# Patient Record
Sex: Male | Born: 1965
Health system: Southern US, Community
[De-identification: ages and names within clinical notes are randomized; demographics above are authoritative.]

## PROBLEM LIST (undated history)

## (undated) DIAGNOSIS — F329 Major depressive disorder, single episode, unspecified: Secondary | ICD-10-CM

## (undated) DIAGNOSIS — B20 Human immunodeficiency virus [HIV] disease: Secondary | ICD-10-CM

## (undated) DIAGNOSIS — A539 Syphilis, unspecified: Secondary | ICD-10-CM

## (undated) DIAGNOSIS — Z21 Asymptomatic human immunodeficiency virus [HIV] infection status: Secondary | ICD-10-CM

## (undated) DIAGNOSIS — R739 Hyperglycemia, unspecified: Secondary | ICD-10-CM

## (undated) DIAGNOSIS — A159 Respiratory tuberculosis unspecified: Secondary | ICD-10-CM

## (undated) DIAGNOSIS — F32A Depression, unspecified: Secondary | ICD-10-CM

## (undated) DIAGNOSIS — F419 Anxiety disorder, unspecified: Secondary | ICD-10-CM

## (undated) DIAGNOSIS — N200 Calculus of kidney: Secondary | ICD-10-CM

## (undated) DIAGNOSIS — I10 Essential (primary) hypertension: Secondary | ICD-10-CM

## (undated) HISTORY — DX: Calculus of kidney: N20.0

## (undated) HISTORY — DX: Hyperglycemia, unspecified: R73.9

## (undated) HISTORY — DX: Anxiety disorder, unspecified: F41.9

## (undated) HISTORY — DX: Essential (primary) hypertension: I10

## (undated) HISTORY — DX: Syphilis, unspecified: A53.9

## (undated) HISTORY — DX: Respiratory tuberculosis unspecified: A15.9

## (undated) HISTORY — DX: Asymptomatic human immunodeficiency virus (hiv) infection status: Z21

## (undated) HISTORY — DX: Depression, unspecified: F32.A

## (undated) HISTORY — DX: Major depressive disorder, single episode, unspecified: F32.9

## (undated) HISTORY — DX: Human immunodeficiency virus (HIV) disease: B20

---

## 1998-02-26 DIAGNOSIS — B20 Human immunodeficiency virus [HIV] disease: Secondary | ICD-10-CM | POA: Insufficient documentation

## 1998-03-16 ENCOUNTER — Encounter (INDEPENDENT_AMBULATORY_CARE_PROVIDER_SITE_OTHER): Payer: Self-pay | Admitting: *Deleted

## 1998-03-16 LAB — CONVERTED CEMR LAB
CD4 Count: 50 microliters
CD4 T Cell Abs: 50

## 1998-03-18 ENCOUNTER — Encounter: Admission: RE | Admit: 1998-03-18 | Discharge: 1998-03-18 | Payer: Self-pay | Admitting: Infectious Diseases

## 1998-03-25 ENCOUNTER — Encounter: Admission: RE | Admit: 1998-03-25 | Discharge: 1998-03-25 | Payer: Self-pay | Admitting: Infectious Diseases

## 1998-04-01 ENCOUNTER — Encounter: Admission: RE | Admit: 1998-04-01 | Discharge: 1998-04-01 | Payer: Self-pay | Admitting: Infectious Diseases

## 1998-04-07 ENCOUNTER — Encounter: Admission: RE | Admit: 1998-04-07 | Discharge: 1998-04-07 | Payer: Self-pay | Admitting: Infectious Diseases

## 1998-06-02 ENCOUNTER — Encounter: Admission: RE | Admit: 1998-06-02 | Discharge: 1998-06-02 | Payer: Self-pay | Admitting: Infectious Diseases

## 1998-06-02 ENCOUNTER — Ambulatory Visit (HOSPITAL_COMMUNITY): Admission: RE | Admit: 1998-06-02 | Discharge: 1998-06-02 | Payer: Self-pay | Admitting: Infectious Diseases

## 1998-07-28 ENCOUNTER — Encounter: Admission: RE | Admit: 1998-07-28 | Discharge: 1998-07-28 | Payer: Self-pay | Admitting: Internal Medicine

## 1998-08-27 ENCOUNTER — Encounter: Admission: RE | Admit: 1998-08-27 | Discharge: 1998-08-27 | Payer: Self-pay | Admitting: Infectious Diseases

## 1998-09-22 ENCOUNTER — Encounter: Admission: RE | Admit: 1998-09-22 | Discharge: 1998-09-22 | Payer: Self-pay | Admitting: Infectious Diseases

## 1998-11-17 ENCOUNTER — Encounter: Admission: RE | Admit: 1998-11-17 | Discharge: 1998-11-17 | Payer: Self-pay | Admitting: Infectious Diseases

## 1999-01-13 ENCOUNTER — Encounter: Admission: RE | Admit: 1999-01-13 | Discharge: 1999-01-13 | Payer: Self-pay | Admitting: Infectious Diseases

## 1999-02-09 ENCOUNTER — Encounter: Admission: RE | Admit: 1999-02-09 | Discharge: 1999-02-09 | Payer: Self-pay | Admitting: Infectious Diseases

## 1999-03-09 ENCOUNTER — Encounter: Admission: RE | Admit: 1999-03-09 | Discharge: 1999-03-09 | Payer: Self-pay | Admitting: Infectious Diseases

## 1999-05-06 ENCOUNTER — Encounter: Admission: RE | Admit: 1999-05-06 | Discharge: 1999-05-06 | Payer: Self-pay | Admitting: Infectious Diseases

## 1999-06-30 ENCOUNTER — Encounter: Admission: RE | Admit: 1999-06-30 | Discharge: 1999-06-30 | Payer: Self-pay | Admitting: Infectious Diseases

## 1999-08-25 ENCOUNTER — Encounter: Admission: RE | Admit: 1999-08-25 | Discharge: 1999-08-25 | Payer: Self-pay | Admitting: Infectious Diseases

## 1999-10-19 ENCOUNTER — Encounter: Admission: RE | Admit: 1999-10-19 | Discharge: 1999-10-19 | Payer: Self-pay | Admitting: Infectious Diseases

## 1999-12-16 ENCOUNTER — Encounter: Admission: RE | Admit: 1999-12-16 | Discharge: 1999-12-16 | Payer: Self-pay | Admitting: Infectious Diseases

## 2000-01-20 ENCOUNTER — Encounter: Admission: RE | Admit: 2000-01-20 | Discharge: 2000-01-20 | Payer: Self-pay | Admitting: Infectious Diseases

## 2000-02-08 ENCOUNTER — Encounter: Admission: RE | Admit: 2000-02-08 | Discharge: 2000-02-08 | Payer: Self-pay | Admitting: Infectious Diseases

## 2000-02-08 ENCOUNTER — Ambulatory Visit (HOSPITAL_COMMUNITY): Admission: RE | Admit: 2000-02-08 | Discharge: 2000-02-08 | Payer: Self-pay | Admitting: Infectious Diseases

## 2000-03-30 ENCOUNTER — Encounter: Admission: RE | Admit: 2000-03-30 | Discharge: 2000-03-30 | Payer: Self-pay | Admitting: Infectious Diseases

## 2000-06-01 ENCOUNTER — Encounter: Admission: RE | Admit: 2000-06-01 | Discharge: 2000-06-01 | Payer: Self-pay | Admitting: Infectious Diseases

## 2000-07-26 ENCOUNTER — Encounter: Admission: RE | Admit: 2000-07-26 | Discharge: 2000-07-26 | Payer: Self-pay | Admitting: Infectious Diseases

## 2000-08-31 ENCOUNTER — Encounter: Admission: RE | Admit: 2000-08-31 | Discharge: 2000-08-31 | Payer: Self-pay | Admitting: Infectious Diseases

## 2000-09-01 ENCOUNTER — Ambulatory Visit (HOSPITAL_COMMUNITY): Admission: RE | Admit: 2000-09-01 | Discharge: 2000-09-01 | Payer: Self-pay | Admitting: Infectious Diseases

## 2000-09-01 ENCOUNTER — Encounter: Payer: Self-pay | Admitting: Infectious Diseases

## 2000-09-05 ENCOUNTER — Encounter: Admission: RE | Admit: 2000-09-05 | Discharge: 2000-09-05 | Payer: Self-pay | Admitting: Infectious Diseases

## 2000-09-19 ENCOUNTER — Encounter: Admission: RE | Admit: 2000-09-19 | Discharge: 2000-09-19 | Payer: Self-pay | Admitting: Infectious Diseases

## 2000-10-03 ENCOUNTER — Encounter: Admission: RE | Admit: 2000-10-03 | Discharge: 2000-10-03 | Payer: Self-pay | Admitting: Infectious Diseases

## 2000-10-03 ENCOUNTER — Ambulatory Visit (HOSPITAL_COMMUNITY): Admission: RE | Admit: 2000-10-03 | Discharge: 2000-10-03 | Payer: Self-pay | Admitting: Infectious Diseases

## 2000-10-03 ENCOUNTER — Encounter: Payer: Self-pay | Admitting: Infectious Diseases

## 2000-10-31 ENCOUNTER — Encounter: Admission: RE | Admit: 2000-10-31 | Discharge: 2000-10-31 | Payer: Self-pay | Admitting: Infectious Diseases

## 2000-11-15 ENCOUNTER — Encounter: Admission: RE | Admit: 2000-11-15 | Discharge: 2000-11-15 | Payer: Self-pay | Admitting: Infectious Diseases

## 2001-01-10 ENCOUNTER — Encounter: Admission: RE | Admit: 2001-01-10 | Discharge: 2001-01-10 | Payer: Self-pay | Admitting: Infectious Diseases

## 2001-01-31 ENCOUNTER — Ambulatory Visit (HOSPITAL_COMMUNITY): Admission: RE | Admit: 2001-01-31 | Discharge: 2001-01-31 | Payer: Self-pay | Admitting: Infectious Diseases

## 2001-01-31 ENCOUNTER — Encounter: Payer: Self-pay | Admitting: Infectious Diseases

## 2001-03-06 ENCOUNTER — Encounter: Admission: RE | Admit: 2001-03-06 | Discharge: 2001-03-06 | Payer: Self-pay | Admitting: Infectious Diseases

## 2001-03-07 ENCOUNTER — Encounter: Admission: RE | Admit: 2001-03-07 | Discharge: 2001-03-07 | Payer: Self-pay | Admitting: Infectious Diseases

## 2001-05-01 ENCOUNTER — Encounter: Admission: RE | Admit: 2001-05-01 | Discharge: 2001-05-01 | Payer: Self-pay | Admitting: Infectious Diseases

## 2001-05-29 ENCOUNTER — Encounter: Admission: RE | Admit: 2001-05-29 | Discharge: 2001-05-29 | Payer: Self-pay | Admitting: Infectious Diseases

## 2001-06-27 ENCOUNTER — Encounter: Admission: RE | Admit: 2001-06-27 | Discharge: 2001-06-27 | Payer: Self-pay | Admitting: Infectious Diseases

## 2001-08-29 ENCOUNTER — Encounter: Admission: RE | Admit: 2001-08-29 | Discharge: 2001-08-29 | Payer: Self-pay | Admitting: Infectious Diseases

## 2001-12-12 ENCOUNTER — Encounter: Admission: RE | Admit: 2001-12-12 | Discharge: 2001-12-12 | Payer: Self-pay | Admitting: Infectious Diseases

## 2002-01-15 ENCOUNTER — Encounter: Admission: RE | Admit: 2002-01-15 | Discharge: 2002-01-15 | Payer: Self-pay | Admitting: Infectious Diseases

## 2002-04-02 ENCOUNTER — Encounter: Admission: RE | Admit: 2002-04-02 | Discharge: 2002-04-02 | Payer: Self-pay | Admitting: Infectious Diseases

## 2002-07-23 ENCOUNTER — Encounter: Admission: RE | Admit: 2002-07-23 | Discharge: 2002-07-23 | Payer: Self-pay | Admitting: Infectious Diseases

## 2002-10-24 ENCOUNTER — Encounter: Admission: RE | Admit: 2002-10-24 | Discharge: 2002-10-24 | Payer: Self-pay | Admitting: Infectious Diseases

## 2002-11-12 ENCOUNTER — Encounter: Admission: RE | Admit: 2002-11-12 | Discharge: 2002-11-12 | Payer: Self-pay | Admitting: Infectious Diseases

## 2003-03-04 ENCOUNTER — Encounter: Admission: RE | Admit: 2003-03-04 | Discharge: 2003-03-04 | Payer: Self-pay | Admitting: Infectious Diseases

## 2003-10-21 ENCOUNTER — Encounter: Admission: RE | Admit: 2003-10-21 | Discharge: 2003-10-21 | Payer: Self-pay | Admitting: Infectious Diseases

## 2003-12-09 ENCOUNTER — Encounter: Admission: RE | Admit: 2003-12-09 | Discharge: 2003-12-09 | Payer: Self-pay | Admitting: Infectious Diseases

## 2004-02-03 ENCOUNTER — Encounter: Admission: RE | Admit: 2004-02-03 | Discharge: 2004-02-03 | Payer: Self-pay | Admitting: Infectious Diseases

## 2004-05-26 ENCOUNTER — Ambulatory Visit: Payer: Self-pay | Admitting: Infectious Diseases

## 2004-09-15 ENCOUNTER — Ambulatory Visit: Payer: Self-pay | Admitting: Infectious Diseases

## 2005-01-04 ENCOUNTER — Ambulatory Visit: Payer: Self-pay | Admitting: Infectious Diseases

## 2005-01-08 ENCOUNTER — Ambulatory Visit: Payer: Self-pay | Admitting: Infectious Diseases

## 2005-01-18 ENCOUNTER — Ambulatory Visit: Payer: Self-pay | Admitting: Infectious Diseases

## 2005-02-08 ENCOUNTER — Ambulatory Visit: Payer: Self-pay | Admitting: Infectious Diseases

## 2005-03-08 ENCOUNTER — Ambulatory Visit: Payer: Self-pay | Admitting: Infectious Diseases

## 2005-04-14 ENCOUNTER — Ambulatory Visit (HOSPITAL_COMMUNITY): Admission: RE | Admit: 2005-04-14 | Discharge: 2005-04-14 | Payer: Self-pay | Admitting: Infectious Diseases

## 2005-04-14 IMAGING — CR DG CHEST 2V
2 series · 2 of 2 positions shown · non-contrast
Comparison: [DATE]

CLINICAL DATA: History of TB exposure, asymptomatic.
 CHEST ? 2 VIEWS:

[view not recorded (1 of 2)]
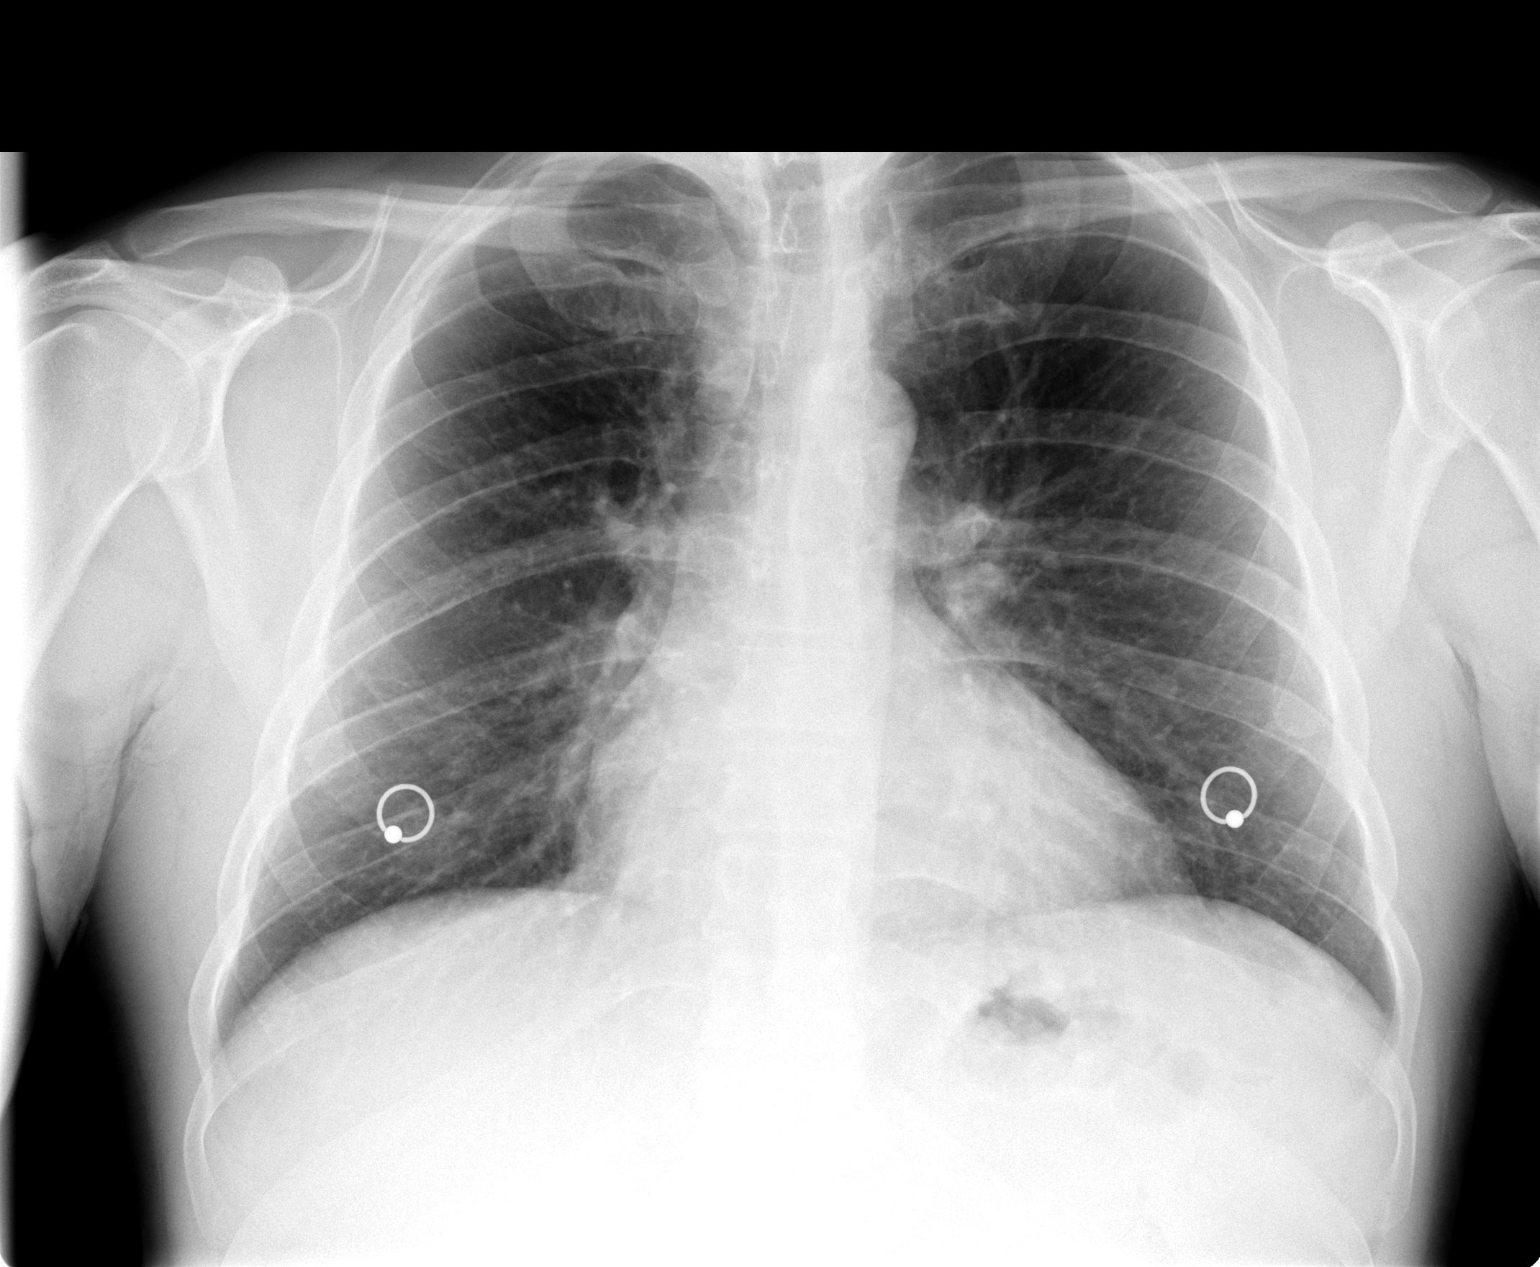

[view not recorded (2 of 2)]
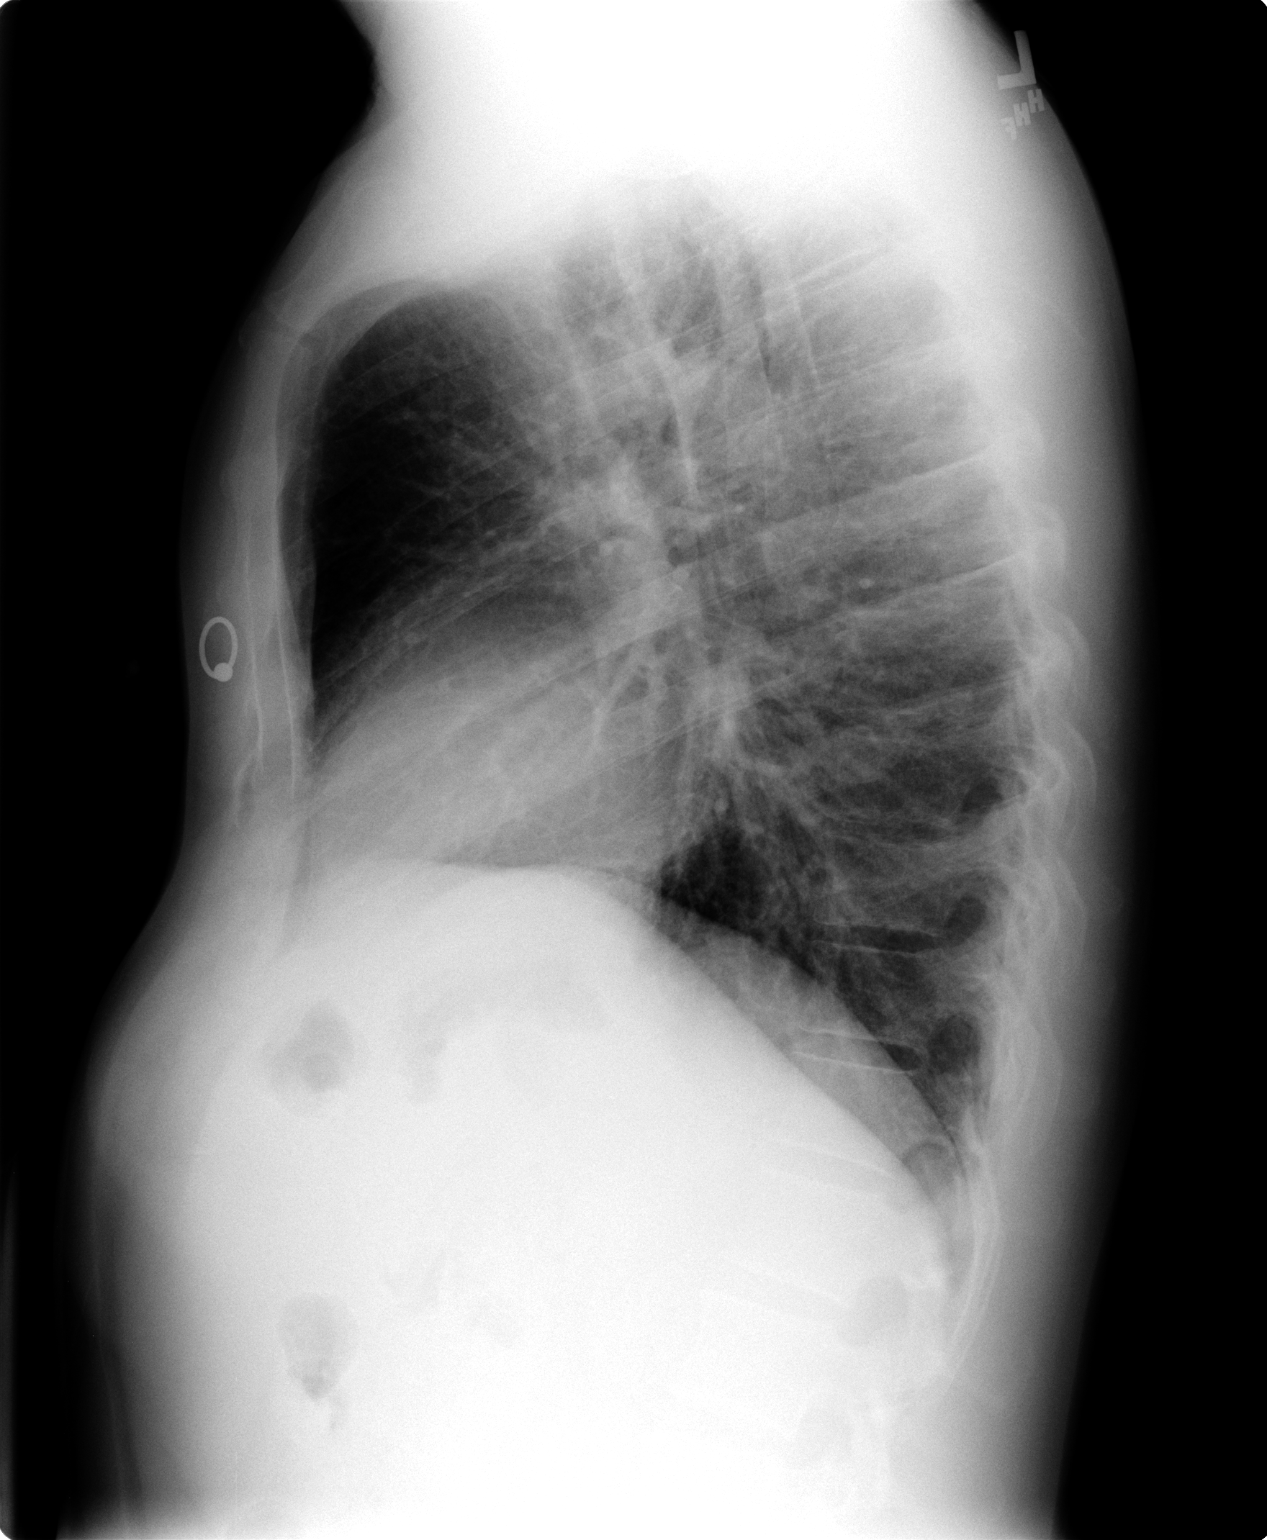

[2 of 2 positions shown; findings below may reference images not displayed]

FINDINGS: PA and lateral views of the chest are made and are compared to previous studies of [DATE] and again show diffuse mild peribronchial thickening.  No evidence of active infiltrate or consolidation is seen.  No evidence of right hilar mass or interval change has occurred.  The heart is within the normal limit.  The bony thorax is unchanged and is within the limits of normal.  The heart and aorta are normal.
IMPRESSION: Stable diffuse peribronchial thickening.  No evidence of active disease in the chest.

## 2005-04-26 ENCOUNTER — Ambulatory Visit: Payer: Self-pay | Admitting: Infectious Diseases

## 2005-06-28 ENCOUNTER — Ambulatory Visit: Payer: Self-pay | Admitting: Infectious Diseases

## 2005-08-17 ENCOUNTER — Encounter (INDEPENDENT_AMBULATORY_CARE_PROVIDER_SITE_OTHER): Payer: Self-pay | Admitting: *Deleted

## 2005-08-17 ENCOUNTER — Ambulatory Visit: Payer: Self-pay | Admitting: Infectious Diseases

## 2005-08-17 LAB — CONVERTED CEMR LAB
CD4 Count: 630 microliters
HIV 1 RNA Quant: 49 copies/mL

## 2005-11-22 ENCOUNTER — Encounter (INDEPENDENT_AMBULATORY_CARE_PROVIDER_SITE_OTHER): Payer: Self-pay | Admitting: *Deleted

## 2005-11-22 ENCOUNTER — Ambulatory Visit: Payer: Self-pay | Admitting: Infectious Diseases

## 2005-11-22 LAB — CONVERTED CEMR LAB
CD4 Count: 449 microliters
HIV 1 RNA Quant: 49 copies/mL

## 2006-03-14 ENCOUNTER — Ambulatory Visit: Payer: Self-pay | Admitting: Infectious Diseases

## 2006-03-18 ENCOUNTER — Ambulatory Visit: Payer: Self-pay | Admitting: Internal Medicine

## 2006-05-09 ENCOUNTER — Ambulatory Visit: Payer: Self-pay | Admitting: Infectious Diseases

## 2006-06-14 ENCOUNTER — Encounter (INDEPENDENT_AMBULATORY_CARE_PROVIDER_SITE_OTHER): Payer: Self-pay | Admitting: *Deleted

## 2006-06-14 ENCOUNTER — Ambulatory Visit: Payer: Self-pay | Admitting: Infectious Diseases

## 2006-06-14 LAB — CONVERTED CEMR LAB
ALT: 29 units/L (ref 0–40)
AST: 21 units/L (ref 0–37)
Albumin: 4.5 g/dL (ref 3.5–5.2)
Alkaline Phosphatase: 108 units/L (ref 39–117)
BUN: 19 mg/dL (ref 6–23)
CD4 Count: 637 microliters
CO2: 24 meq/L (ref 19–32)
Calcium: 8.8 mg/dL (ref 8.4–10.5)
Chloride: 108 meq/L (ref 96–112)
Cholesterol: 146 mg/dL (ref 0–200)
Creatinine, Ser: 0.75 mg/dL (ref 0.40–1.50)
Creatinine, Urine: 210.9 mg/dL
Glucose, Bld: 111 mg/dL — ABNORMAL HIGH (ref 70–99)
HDL: 36 mg/dL — ABNORMAL LOW (ref >39)
HIV 1 RNA Quant: 49 copies/mL
LDL Cholesterol: 86 mg/dL (ref 0–99)
Potassium: 3.8 meq/L (ref 3.5–5.3)
RPR Ser Ql: REACTIVE — AB
RPR Titer: 1:4 {titer}
Sodium: 141 meq/L (ref 135–145)
Total Bilirubin: 0.4 mg/dL (ref 0.3–1.2)
Total CHOL/HDL Ratio: 4.1
Total Protein, Urine: 19
Total Protein: 7.1 g/dL (ref 6.0–8.3)
Triglycerides: 120 mg/dL (ref <150)
VLDL: 24 mg/dL (ref 0–40)

## 2006-06-17 DIAGNOSIS — Z8619 Personal history of other infectious and parasitic diseases: Secondary | ICD-10-CM

## 2006-06-17 DIAGNOSIS — A150 Tuberculosis of lung: Secondary | ICD-10-CM | POA: Insufficient documentation

## 2006-06-17 DIAGNOSIS — F339 Major depressive disorder, recurrent, unspecified: Secondary | ICD-10-CM

## 2006-09-27 ENCOUNTER — Encounter (INDEPENDENT_AMBULATORY_CARE_PROVIDER_SITE_OTHER): Payer: Self-pay | Admitting: Infectious Diseases

## 2006-10-10 ENCOUNTER — Encounter (INDEPENDENT_AMBULATORY_CARE_PROVIDER_SITE_OTHER): Payer: Self-pay | Admitting: *Deleted

## 2006-10-10 LAB — CONVERTED CEMR LAB: RPR Titer: 1:4 {titer}

## 2006-10-23 ENCOUNTER — Encounter (INDEPENDENT_AMBULATORY_CARE_PROVIDER_SITE_OTHER): Payer: Self-pay | Admitting: *Deleted

## 2006-10-26 ENCOUNTER — Encounter (INDEPENDENT_AMBULATORY_CARE_PROVIDER_SITE_OTHER): Payer: Self-pay | Admitting: *Deleted

## 2006-12-01 ENCOUNTER — Telehealth (INDEPENDENT_AMBULATORY_CARE_PROVIDER_SITE_OTHER): Payer: Self-pay | Admitting: Infectious Diseases

## 2006-12-01 ENCOUNTER — Encounter (INDEPENDENT_AMBULATORY_CARE_PROVIDER_SITE_OTHER): Payer: Self-pay | Admitting: Infectious Diseases

## 2007-02-13 ENCOUNTER — Encounter (INDEPENDENT_AMBULATORY_CARE_PROVIDER_SITE_OTHER): Payer: Self-pay | Admitting: Infectious Diseases

## 2007-05-08 ENCOUNTER — Ambulatory Visit: Payer: Self-pay | Admitting: Infectious Diseases

## 2007-05-11 ENCOUNTER — Encounter: Payer: Self-pay | Admitting: *Deleted

## 2007-07-24 ENCOUNTER — Ambulatory Visit: Payer: Self-pay | Admitting: Infectious Disease

## 2007-07-24 DIAGNOSIS — I10 Essential (primary) hypertension: Secondary | ICD-10-CM | POA: Insufficient documentation

## 2007-07-24 DIAGNOSIS — A6000 Herpesviral infection of urogenital system, unspecified: Secondary | ICD-10-CM | POA: Insufficient documentation

## 2007-07-24 DIAGNOSIS — F17200 Nicotine dependence, unspecified, uncomplicated: Secondary | ICD-10-CM

## 2007-08-14 ENCOUNTER — Encounter (INDEPENDENT_AMBULATORY_CARE_PROVIDER_SITE_OTHER): Payer: Self-pay | Admitting: *Deleted

## 2007-08-15 ENCOUNTER — Encounter (INDEPENDENT_AMBULATORY_CARE_PROVIDER_SITE_OTHER): Payer: Self-pay | Admitting: *Deleted

## 2007-08-28 ENCOUNTER — Ambulatory Visit: Payer: Self-pay | Admitting: Infectious Disease

## 2007-12-04 ENCOUNTER — Ambulatory Visit: Payer: Self-pay | Admitting: Infectious Disease

## 2007-12-04 LAB — CONVERTED CEMR LAB
AST: 25 units/L (ref 0–37)
Albumin: 4.5 g/dL (ref 3.5–5.2)
Alkaline Phosphatase: 118 units/L — ABNORMAL HIGH (ref 39–117)
BUN: 15 mg/dL (ref 6–23)
Basophils Absolute: 0 10*3/uL (ref 0.0–0.1)
CO2: 24 meq/L (ref 19–32)
Chloride: 106 meq/L (ref 96–112)
HDL: 31 mg/dL — ABNORMAL LOW (ref >39)
Hemoglobin: 15.8 g/dL (ref 13.0–17.0)
LDL Cholesterol: 79 mg/dL (ref 0–99)
Lymphocytes Relative: 36 % (ref 12–46)
Monocytes Absolute: 0.5 10*3/uL (ref 0.1–1.0)
Monocytes Relative: 8 % (ref 3–12)
Neutro Abs: 3.2 10*3/uL (ref 1.7–7.7)
RBC: 5.11 M/uL (ref 4.22–5.81)
Total Bilirubin: 0.5 mg/dL (ref 0.3–1.2)
Total Protein: 7.5 g/dL (ref 6.0–8.3)
Triglycerides: 133 mg/dL (ref <150)
VLDL: 27 mg/dL (ref 0–40)

## 2008-02-08 ENCOUNTER — Telehealth (INDEPENDENT_AMBULATORY_CARE_PROVIDER_SITE_OTHER): Payer: Self-pay | Admitting: *Deleted

## 2008-04-01 ENCOUNTER — Ambulatory Visit: Payer: Self-pay | Admitting: Infectious Disease

## 2008-07-17 ENCOUNTER — Encounter: Payer: Self-pay | Admitting: Infectious Disease

## 2008-07-24 ENCOUNTER — Encounter: Payer: Self-pay | Admitting: Infectious Disease

## 2008-07-29 ENCOUNTER — Ambulatory Visit: Payer: Self-pay | Admitting: Infectious Disease

## 2008-07-29 ENCOUNTER — Encounter: Payer: Self-pay | Admitting: Infectious Disease

## 2008-07-29 LAB — CONVERTED CEMR LAB
Albumin: 4.6 g/dL (ref 3.5–5.2)
Alkaline Phosphatase: 111 units/L (ref 39–117)
BUN: 11 mg/dL (ref 6–23)
Creatinine, Ser: 0.74 mg/dL (ref 0.40–1.50)
Glucose, Bld: 106 mg/dL — ABNORMAL HIGH (ref 70–99)
HDL: 31 mg/dL — ABNORMAL LOW (ref >39)
LDL Cholesterol: 94 mg/dL (ref 0–99)
RPR Titer: 1:2 {titer}
Total Bilirubin: 0.5 mg/dL (ref 0.3–1.2)
Total CHOL/HDL Ratio: 5
Triglycerides: 145 mg/dL (ref <150)
VLDL: 29 mg/dL (ref 0–40)

## 2008-11-25 ENCOUNTER — Ambulatory Visit: Payer: Self-pay | Admitting: Infectious Disease

## 2008-11-25 LAB — CONVERTED CEMR LAB
Albumin: 4.5 g/dL (ref 3.5–5.2)
Alkaline Phosphatase: 101 units/L (ref 39–117)
Basophils Absolute: 0 10*3/uL (ref 0.0–0.1)
Chloride: 106 meq/L (ref 96–112)
Eosinophils Absolute: 0.3 10*3/uL (ref 0.0–0.7)
Eosinophils Relative: 4 % (ref 0–5)
GFR calc non Af Amer: >60 mL/min (ref >60)
Glucose, Bld: 97 mg/dL (ref 70–99)
HCT: 45.7 % (ref 39.0–52.0)
LDL Cholesterol: 98 mg/dL (ref 0–99)
Lymphocytes Relative: 35 % (ref 12–46)
MCV: 90 fL (ref 78.0–100.0)
Platelets: 205 10*3/uL (ref 150–400)
Potassium: 3.8 meq/L (ref 3.5–5.3)
RDW: 13.3 % (ref 11.5–15.5)
Sodium: 139 meq/L (ref 135–145)
Total Protein: 7.1 g/dL (ref 6.0–8.3)

## 2009-03-10 ENCOUNTER — Ambulatory Visit: Payer: Self-pay | Admitting: Infectious Disease

## 2009-06-05 ENCOUNTER — Encounter: Payer: Self-pay | Admitting: Infectious Disease

## 2009-06-23 ENCOUNTER — Ambulatory Visit: Payer: Self-pay | Admitting: Infectious Disease

## 2009-06-23 ENCOUNTER — Encounter: Payer: Self-pay | Admitting: Infectious Disease

## 2009-06-23 LAB — CONVERTED CEMR LAB
Albumin: 4.5 g/dL (ref 3.5–5.2)
Alkaline Phosphatase: 107 units/L (ref 39–117)
BUN: 13 mg/dL (ref 6–23)
CD4 Count: 929 microliters
CO2: 22 meq/L (ref 19–32)
Cholesterol: 159 mg/dL (ref 0–200)
Glucose, Bld: 101 mg/dL — ABNORMAL HIGH (ref 70–99)
HDL: 31 mg/dL — ABNORMAL LOW (ref >39)
HIV 1 RNA Quant: 39 copies/mL
LDL Cholesterol: 97 mg/dL (ref 0–99)
Potassium: 3.8 meq/L (ref 3.5–5.3)
Triglycerides: 157 mg/dL — ABNORMAL HIGH (ref <150)

## 2009-09-17 ENCOUNTER — Encounter (INDEPENDENT_AMBULATORY_CARE_PROVIDER_SITE_OTHER): Payer: Self-pay | Admitting: *Deleted

## 2009-11-10 ENCOUNTER — Ambulatory Visit: Payer: Self-pay | Admitting: Infectious Disease

## 2009-11-10 LAB — CONVERTED CEMR LAB
ALT: 56 units/L — ABNORMAL HIGH (ref 0–53)
AST: 35 units/L (ref 0–37)
Albumin: 4.7 g/dL (ref 3.5–5.2)
Alkaline Phosphatase: 115 units/L (ref 39–117)
BUN: 16 mg/dL (ref 6–23)
Basophils Absolute: 0 10*3/uL (ref 0.0–0.1)
Basophils Relative: 0 % (ref 0–1)
CO2: 24 meq/L (ref 19–32)
Calcium: 9.3 mg/dL (ref 8.4–10.5)
Chloride: 106 meq/L (ref 96–112)
Cholesterol: 168 mg/dL (ref 0–200)
Creatinine, Ser: 0.79 mg/dL (ref 0.40–1.50)
Eosinophils Absolute: 0.3 10*3/uL (ref 0.0–0.7)
Eosinophils Relative: 4 % (ref 0–5)
Glucose, Bld: 98 mg/dL (ref 70–99)
HCT: 47.4 % (ref 39.0–52.0)
HDL: 31 mg/dL — ABNORMAL LOW (ref >39)
Hemoglobin: 16.2 g/dL (ref 13.0–17.0)
LDL Cholesterol: 102 mg/dL — ABNORMAL HIGH (ref 0–99)
Lymphocytes Relative: 38 % (ref 12–46)
Lymphs Abs: 2.7 10*3/uL (ref 0.7–4.0)
MCHC: 34.2 g/dL (ref 30.0–36.0)
MCV: 91 fL (ref >=78.0)
Monocytes Absolute: 0.7 10*3/uL (ref 0.1–1.0)
Monocytes Relative: 10 % (ref 3–12)
Neutro Abs: 3.4 10*3/uL (ref 1.7–7.7)
Neutrophils Relative %: 48 % (ref 43–77)
Platelets: 235 10*3/uL (ref 150–400)
Potassium: 4.2 meq/L (ref 3.5–5.3)
RBC: 5.21 M/uL (ref 4.22–5.81)
RDW: 13.2 % (ref 11.5–15.5)
Sodium: 141 meq/L (ref 135–145)
Total Bilirubin: 0.5 mg/dL (ref 0.3–1.2)
Total CHOL/HDL Ratio: 5.4
Total Protein: 7.5 g/dL (ref 6.0–8.3)
Triglycerides: 174 mg/dL — ABNORMAL HIGH (ref <150)
VLDL: 35 mg/dL (ref 0–40)
WBC: 7.1 10*3/uL (ref 4.0–10.5)

## 2010-03-02 ENCOUNTER — Ambulatory Visit: Payer: Self-pay | Admitting: Infectious Disease

## 2010-03-02 LAB — CONVERTED CEMR LAB
ALT: 49 units/L (ref 0–53)
Albumin: 4.6 g/dL (ref 3.5–5.2)
Alkaline Phosphatase: 119 units/L — ABNORMAL HIGH (ref 39–117)
CD4 Count: 834 microliters
CO2: 22 meq/L (ref 19–32)
Creatinine, Urine: 17.8 mg/dL
Glucose, Bld: 94 mg/dL (ref 70–99)
LDL Cholesterol: 93 mg/dL (ref 0–99)
Potassium: 3.9 meq/L (ref 3.5–5.3)
Sodium: 141 meq/L (ref 135–145)
Total Bilirubin: 0.5 mg/dL (ref 0.3–1.2)
Total Protein: 7.4 g/dL (ref 6.0–8.3)
Triglycerides: 132 mg/dL (ref <150)
VLDL: 26 mg/dL (ref 0–40)

## 2010-04-06 ENCOUNTER — Ambulatory Visit: Payer: Self-pay | Admitting: Infectious Disease

## 2010-06-29 ENCOUNTER — Ambulatory Visit: Payer: Self-pay | Admitting: Infectious Disease

## 2010-06-29 LAB — CONVERTED CEMR LAB
ALT: 24 units/L (ref 0–53)
Albumin: 4.4 g/dL (ref 3.5–5.2)
CD4 Count: 1023 microliters
CO2: 25 meq/L (ref 19–32)
Calcium: 9 mg/dL (ref 8.4–10.5)
Chloride: 108 meq/L (ref 96–112)
Cholesterol: 179 mg/dL (ref 0–200)
Glucose, Bld: 100 mg/dL — ABNORMAL HIGH (ref 70–99)
Potassium: 3.9 meq/L (ref 3.5–5.3)
Sodium: 140 meq/L (ref 135–145)
Total Protein: 7 g/dL (ref 6.0–8.3)
Triglycerides: 257 mg/dL — ABNORMAL HIGH (ref <150)
VLDL: 51 mg/dL — ABNORMAL HIGH (ref 0–40)

## 2010-06-30 ENCOUNTER — Encounter (INDEPENDENT_AMBULATORY_CARE_PROVIDER_SITE_OTHER): Payer: Self-pay | Admitting: *Deleted

## 2010-09-13 LAB — CONVERTED CEMR LAB
ALT: 28 units/L (ref 0–53)
ALT: 38 units/L (ref 0–53)
ALT: 40 units/L (ref 0–53)
AST: 23 units/L (ref 0–37)
AST: 26 units/L (ref 0–37)
Albumin: 4.5 g/dL (ref 3.5–5.2)
CD4 Count: 675 microliters
CO2: 24 meq/L (ref 19–32)
CO2: 24 meq/L (ref 19–32)
Calcium: 9.1 mg/dL (ref 8.4–10.5)
Calcium: 9.2 mg/dL (ref 8.4–10.5)
Chloride: 106 meq/L (ref 96–112)
Chloride: 107 meq/L (ref 96–112)
Cholesterol: 137 mg/dL (ref 0–200)
Cholesterol: 150 mg/dL (ref 0–200)
Creatinine, Ser: 0.82 mg/dL (ref 0.40–1.50)
Creatinine, Ser: 0.84 mg/dL (ref 0.40–1.50)
Creatinine, Urine: 149.4 mg/dL
Creatinine, Urine: 203.1 mg/dL
Creatinine, Urine: 75 mg/dL
Glucose, Bld: 103 mg/dL — ABNORMAL HIGH (ref 70–99)
HIV 1 RNA Quant: 49 copies/mL
HIV 1 RNA Quant: 49 copies/mL
HIV 1 RNA Quant: 49 copies/mL
Hep A Total Ab: POSITIVE — AB
Hep B Core Total Ab: POSITIVE — AB
Hep B Core Total Ab: POSITIVE — AB
Hepatitis B Surface Ag: NEGATIVE
LDL Cholesterol: 86 mg/dL (ref 0–99)
Potassium: 4 meq/L (ref 3.5–5.3)
Potassium: 4.1 meq/L (ref 3.5–5.3)
Sodium: 140 meq/L (ref 135–145)
Sodium: 140 meq/L (ref 135–145)
Sodium: 140 meq/L (ref 135–145)
Sodium: 141 meq/L (ref 135–145)
Total Bilirubin: 0.5 mg/dL (ref 0.3–1.2)
Total CHOL/HDL Ratio: 4.3
Total CHOL/HDL Ratio: 4.4
Total Protein, Urine: 3
Total Protein, Urine: 8
Total Protein: 7.2 g/dL (ref 6.0–8.3)
Total Protein: 7.4 g/dL (ref 6.0–8.3)
Total Protein: 7.5 g/dL (ref 6.0–8.3)
Total Protein: 7.7 g/dL (ref 6.0–8.3)
Triglycerides: 147 mg/dL (ref <150)
VLDL: 18 mg/dL (ref 0–40)
VLDL: 21 mg/dL (ref 0–40)
VLDL: 28 mg/dL (ref 0–40)

## 2010-09-15 NOTE — Assessment & Plan Note (Signed)
Summary: STUDY APPT/ LH    Current Allergies: No known allergies  Vital Signs:  Patient profile:   45 year old male Weight:      186.4 pounds (84.73 kg) BMI:     26.84 Temp:     97.5 degrees F oral Pulse rate:   65 / minute BP sitting:   123 / 83  (left arm) Is Patient Diabetic? No Pain Assessment Patient in pain? no      Nutritional Status BMI of 25 - 29 = overweight  Does patient need assistance? Functional Status Self care Ambulation Normal   Patient here for week 624 ALLRT study visit. He denies any new problems or complaints. 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 He will return in 1 month for a MD appt.Deirdre Evener RN  March 02, 2010 9:57 AM    Other Orders: Est. Patient Research Study [id number]) T-Comprehensive Metabolic Panel [phone]) T-Lipid Profile [id number]) T-Urine Protein [id number]) T-Urine Creatinine [id number])  Process Orders Check Orders Results:     [facility]: ABN not required for this insurance Order queued for requisitioning for [facility]: March 02, 2010 9:13 AM  Tests Sent for requisitioning (March 02, 2010 9:13 AM):     03/02/2010: [facility] -- T-Comprehensive Metabolic Panel [[id number]] (signed)     03/02/2010: [facility] -- T-Lipid Profile [phone] (signed)     03/02/2010: [facility] -- T-Urine Protein [id number] (signed)     03/02/2010: [facility] -- T-Urine Creatinine [[id number]] (signed)

## 2010-09-15 NOTE — Miscellaneous (Signed)
  Clinical Lists Changes  Observations: Added new observation of YEARAIDSPOS: 1999  (06/30/2010 15:31)

## 2010-09-15 NOTE — Miscellaneous (Signed)
Summary: RW Update  Clinical Lists Changes  Observations: Added new observation of HIV STATUS: CDC-defined AIDS (09/17/2009 15:33)

## 2010-09-15 NOTE — Miscellaneous (Signed)
Summary: HIV-1 RNA, CD4 (RESEARCH)  Clinical Lists Changes  Observations: Added new observation of CD4 COUNT: 834 microliters (03/02/2010 9:20) Added new observation of HIV1RNA QA: 39 copies/mL (03/02/2010 9:20)

## 2010-09-15 NOTE — Assessment & Plan Note (Signed)
Summary: STUDY APPT/ LH    Current Allergies: No known allergies  Vital Signs:  Patient profile:   45 year old male Weight:      187.2 pounds (85.09 kg) BMI:     26.96 Temp:     97.1 degrees F oral Pulse rate:   63 / minute BP sitting:   123 / 83  (right arm) Is Patient Diabetic? No Research Study Name: ALLRT Pain Assessment Patient in pain? no      Nutritional Status BMI of 25 - 29 = overweight  Does patient need assistance? Functional Status Self care Ambulation Normal   Patient here for week 608 ALLRT study visit. Denies any new problems, but did have a GI bug a few weeks ago that lasted 3 days. Deirdre Evener RN  November 10, 2009 9:25 AM   Other Orders: Est. Patient Research Study [id number]) T-Comprehensive Metabolic Panel [id number]) T-Lipid Profile [id number]) T-CBC w/Diff [id number]) Process Orders Check Orders Results:     [facility]: ABN not required for this insurance Tests Sent for requisitioning (November 10, 2009 9:23 AM):     11/10/2009: [facility] -- T-Comprehensive Metabolic Panel [[id number]] (signed)     11/10/2009: [facility] -- T-Lipid Profile [id number] (signed)     11/10/2009: [facility] -- [facility] w/Diff [[id number]] (signed)

## 2010-09-15 NOTE — Assessment & Plan Note (Signed)
Summary: f/u ov/vs   Visit Type:  Follow-up Primary Costa Jha:  Paulette Blanch Dam MD  CC:  f/u.  History of Present Illness: 45 yo Caucasian male with HIV, latent TB,  hx of syphilis, HSV, currently enrolled in [facility] appears for followup appt [facility]. He claimes 100% adherence with his Tyrone Nine a He remains sexually inactive and feels more comfortable psychologically being "single." He  He denies depression. He currently smokes a few cigarettes a day. Otherwise he has no complaints on 12 point  ros. He refused seasonal influenza vaccine  shot which I again encouraged him to take. He has lost 10 pounds through diet and exercise.   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 Take 1 Tablet By Mouth At Bedtime 2)  Chantix Continuing Month Pak 1 Mg Tabs (Varenicline Tartrate) .... As Directed  Current Medications (verified): 1)  Atripla 600-200-300 Mg Tabs (Efavirenz-Emtricitab-Tenofovir) .... Take 1 Tablet By Mouth At Bedtime  Allergies (verified): No Known Drug Allergies   Preventive Screening-Counseling & Management  Alcohol-Tobacco     Alcohol type: rarely     Smoking Status: current     Smoking Cessation Counseling: yes     Packs/Day: 7010/cigs/day     Year Started: 1988     Cans of tobacco/week: no     Passive Smoke Exposure: no   Current Allergies (reviewed today): No known allergies  Past History:  Past Medical History: Last updated: 07/24/2007 Depression HIV disease Tuberculosis Positive serology for syphillis HSV  Past Surgical History: Last updated: 06/23/2009 No surgeries  Family  History: Last updated: 06/23/2009 no early cad  Social History: Last updated: 04/06/2010 Smoker, single  Risk Factors: Caffeine Use: 4 (07/29/2008) Exercise: no (07/29/2008)  Risk Factors: Smoking Status: current (04/06/2010) Packs/Day: 7010/cigs/day (04/06/2010) Cans of tobacco/wk: no (04/06/2010) Passive Smoke Exposure: no (04/06/2010)  Family History: Reviewed history from 06/23/2009 and no changes required. no early cad  Social History: Smoker, single  Review of Systems  The patient denies anorexia, fever, weight loss, weight gain, vision loss, decreased hearing, hoarseness, chest pain, syncope, dyspnea on exertion, peripheral edema, prolonged cough, headaches, hemoptysis, abdominal pain, melena, hematochezia, severe indigestion/heartburn, hematuria, incontinence, genital sores, muscle weakness, suspicious skin lesions, transient blindness, difficulty walking, depression, unusual weight change, abnormal bleeding, and enlarged lymph nodes.    Vital Signs:  Patient profile:   45 year old male Height:      70 inches (177.80 cm) Weight:      177.25 pounds (80.57 kg) BMI:     25.52 Temp:     97.8 degrees F (36.56 degrees C) oral Pulse rate:   59 / minute BP sitting:   122 / 81  (left arm)  Vitals Entered By: Baxter Hire) (April 06, 2010 8:48 AM) CC: f/u Is Patient Diabetic? No Pain Assessment Patient in pain? no      Nutritional Status BMI of 25 - 29 = overweight  Does patient need assistance? Functional Status Self care Ambulation Normal   Physical Exam  General:  alert, well-developed, well-nourished, and well-hydrated.   Head:  normocephalic, atraumatic, no abnormalities observed, and no abnormalities palpated.   Eyes:  vision grossly intact, pupils equal, and pupils round.   Ears:  no external deformities and ear piercing(s) noted.   Nose:  no external deformity, no external erythema, and no nasal discharge.   Mouth:  good dentition, no  gingival abnormalities, no dental plaque, pharynx pink and moist, no erythema, and no exudates.   Neck:  supple and full ROM.   Lungs:  normal respiratory effort, no accessory muscle use, no crackles, and no wheezes.   Heart:  normal rate, regular rhythm, no murmur, no gallop, no rub, and no JVD.   Abdomen:  soft, non-tender, normal bowel sounds, no distention, no masses, no hepatomegaly, and no splenomegaly.   Neurologic:  alert & oriented X3.  strength normal in all extremities, sensation intact to light touch, and gait normal.   Skin:  no rashes.  color normal.   Psych:  Oriented X3, memory intact for recent and remote, and normally interactive.     Impression & Recommendations:  Problem # 1:  HIV DISEASE (ICD-042)  excellent control Diagnostics Reviewed:  HIV: CDC-defined AIDS (09/17/2009)   CD4: 834 (03/02/2010)   WBC: 7.1 (11/10/2009)   Hgb: 16.2 (11/10/2009)   HCT: 47.4 (11/10/2009)   Platelets: 235 (11/10/2009) HIV-1 RNA: 39 (03/02/2010)   HBSAg: NEG (03/10/2009)  Orders: Est. Patient Level IV (04540)  Problem # 2:  HERPES GENITALIS (ICD-054.10)  not casing problems at present  Orders: Est. Patient Level IV (98119)  Problem # 3:  SYPHILIS, LATENT NOS (ICD-097.1)  rpr titer DID go down from  1:64 in 2006 to 1:4 on last check here  Orders: Est. Patient Level IV (14782)  Problem # 4:  TOBACCO USER (ICD-305.1)  counselled to quit The following medications were removed from the medication list:    Chantix Continuing Month Pak 1 Mg Tabs (Varenicline tartrate) .Marland Kitchen... As directed  Orders: Est. Patient Level IV (95621)  Other Orders: T-GC Probe, urine ([id number]) T-Chlamydia  Probe, urine ([id number])       Medication Adherence: 04/06/2010   Adherence to medications reviewed with patient. Counseling to provide adequate adherence provided   Prevention For Positives: 04/06/2010   Safe sex practices discussed with patient. Condoms offered.   Education Materials  Provided: 04/06/2010 Safe sex practices discussed with patient. Condoms offered.                            Patient Instructions: 1)  rtc to see Dr. Daiva Eves in one year

## 2010-09-15 NOTE — Assessment & Plan Note (Signed)
Summary: STUDY APPT/ LH    Current Allergies: No known allergies  Vital Signs:  Patient profile:   45 year old male Weight:      180 pounds (81.82 kg) BMI:     25.92 Temp:     97.4 degrees F oral Pulse rate:   61 / minute BP sitting:   116 / 77  (left arm) Is Patient Diabetic? No Pain Assessment Patient in pain? no      Nutritional Status BMI of 19 -24 = normal  Does patient need assistance? Functional Status Self care Ambulation Normal   Patient here for week 640 Allrt study visit. He denies any problems or complaints. he will return in March for followup.Deirdre Evener RN  June 29, 2010 9:30 AM    Other Orders: Est. Patient Research Study [id number]) T-Lipid Profile [id number]) T-Comprehensive Metabolic Panel [phone])

## 2010-09-17 NOTE — Miscellaneous (Signed)
Summary: HIV-1 RNA, CD4 (RESEARCH)  Clinical Lists Changes  Observations: Added new observation of CD4 COUNT: 1023 microliters (06/29/2010 16:36) Added new observation of HIV1RNA QA: 39 copies/mL (06/29/2010 16:36)

## 2010-10-07 ENCOUNTER — Telehealth: Payer: Self-pay | Admitting: Infectious Disease

## 2010-10-19 ENCOUNTER — Ambulatory Visit: Payer: 59

## 2010-10-19 ENCOUNTER — Encounter: Payer: Self-pay | Admitting: Infectious Disease

## 2010-10-19 LAB — CONVERTED CEMR LAB
BUN: 11 mg/dL (ref 6–23)
CO2: 24 meq/L (ref 19–32)
Cholesterol: 153 mg/dL (ref 0–200)
Creatinine, Ser: 0.78 mg/dL (ref 0.40–1.50)
Glucose, Bld: 99 mg/dL (ref 70–99)
Total Bilirubin: 0.4 mg/dL (ref 0.3–1.2)
Total CHOL/HDL Ratio: 5.5
Triglycerides: 186 mg/dL — ABNORMAL HIGH (ref <150)
VLDL: 37 mg/dL (ref 0–40)

## 2010-10-22 NOTE — Progress Notes (Addendum)
Summary: Pt needs FLU shot  Phone Note Outgoing Call   Call placed by: Acey Lav MD,  October 07, 2010 3:55 PM Summary of Call: Called to see if he would come in for flu shot. I left message for him to call me back. Can we call him again. He appears to have refused flu shot in 2008 and if he refuses again over the phone or in person that is fine as long as it is documented Initial call taken by: Acey Lav MD,  October 07, 2010 3:55 PM  Follow-up for Phone Call        LM asking him to call back & let me know if he wants a flu shot Follow-up by: Golden Circle RN,  October 09, 2010 9:31 AM        Appended Document: Pt needs FLU shot Still waiting to hear back from patient re flu shot  Appended Document: CALL BACK AGAIN DID HE GET FLU SHOT? cALL BACK PT RE FLU SHOT. DID HE GET IT FROM [facility]?  Spoke with Selena Batten and he has been refusing his flu vaccines. Looks like the last one was in 2007. Thanks, Tania Ade  Clinical Lists Changes       Appended Document: Pt needs FLU shot    patient refused this flu season 2011 [facility]  October 27, 2010 10:07 AM

## 2010-10-27 NOTE — Assessment & Plan Note (Signed)
Summary: STUDY APPT   Vital Signs:  Patient profile:   45 year old male Height:      70 inches (177.80 cm) Weight:      185.0 pounds (84.09 kg) Temp:     97.8 degrees F (36.56 degrees C) oral Pulse rate:   58 / minute BP sitting:   126 / 84  (right arm)  Allergies: No Known Drug Allergies  10/19/10 Research Visit A5001 week 656: Assessment unchanged since last study visit. Pt denies any new findings.Vital signs stable. Received $50.00 for study visit. Next study appointment for week 672 is scheduled for Monday, February 08, 2011 at 9am. Tacey Heap RN  October 19, 2010 10:40 AM  Other Orders: T-Comprehensive Metabolic Panel [id number]) T-Lipid Profile [id number]) T-Urine Protein [id number]) T-Urine Creatinine [id number])   Orders Added: 1)  T-Comprehensive Metabolic Panel [[id number]] 2)  T-Lipid Profile [[id number]] 3)  T-Urine Protein [[id number]] 4)  T-Urine Creatinine [[id number]]

## 2010-11-09 ENCOUNTER — Encounter: Payer: Self-pay | Admitting: Infectious Disease

## 2010-11-09 LAB — CD4/CD8 (T-HELPER/T-SUPPRESSOR CELL)
CD4 T Cell Abs: 832
CD8 % Suppressor T Cell: 37.5

## 2010-11-09 LAB — HIV-1 RNA QUANT-NO REFLEX-BLD: HIV 1 RNA Quant: <40

## 2011-02-08 ENCOUNTER — Ambulatory Visit: Payer: Self-pay

## 2011-02-08 ENCOUNTER — Ambulatory Visit (INDEPENDENT_AMBULATORY_CARE_PROVIDER_SITE_OTHER): Payer: 59 | Admitting: *Deleted

## 2011-02-08 VITALS — BP 124/86 | HR 58 | Temp 97.7°F | Ht 70.0 in | Wt 183.8 lb

## 2011-02-08 DIAGNOSIS — B20 Human immunodeficiency virus [HIV] disease: Secondary | ICD-10-CM

## 2011-02-08 LAB — COMPREHENSIVE METABOLIC PANEL
ALT: 27 U/L (ref 0–53)
AST: 25 U/L (ref 0–37)
Calcium: 9.4 mg/dL (ref 8.4–10.5)
Chloride: 105 mEq/L (ref 96–112)
Creat: 0.74 mg/dL (ref 0.50–1.35)
Potassium: 3.7 mEq/L (ref 3.5–5.3)

## 2011-02-08 LAB — LIPID PANEL
LDL Cholesterol: 110 mg/dL — ABNORMAL HIGH (ref 0–99)
Total CHOL/HDL Ratio: 5.2 Ratio

## 2011-02-08 NOTE — Progress Notes (Signed)
02/08/2011 @ 0900: Pt here for research study visit A5001, week 672. Assessment unchanged since last study visit. Pt denies any new findings or symptoms. Vitals stable. Fasting labs were drawn. Pt received $50 gift card for visit. Next research appointment scheduled for Monday, June 14, 2011 at 0900. Tacey Heap RN

## 2011-02-09 LAB — CREATININE, URINE, RANDOM: Creatinine, Urine: 16.6 mg/dL

## 2011-02-09 LAB — PROTEIN, URINE, RANDOM: Total Protein, Urine: <3 mg/dL

## 2011-02-18 ENCOUNTER — Encounter: Payer: Self-pay | Admitting: Infectious Disease

## 2011-02-18 LAB — CD4/CD8 (T-HELPER/T-SUPPRESSOR CELL)
CD4%: 41
CD4: 902
CD8: 810

## 2011-03-26 ENCOUNTER — Other Ambulatory Visit: Payer: Self-pay | Admitting: *Deleted

## 2011-03-26 MED ORDER — EFAVIRENZ-EMTRICITAB-TENOFOVIR 600-200-300 MG PO TABS: 1.0000 | ORAL_TABLET | Freq: Every day | ORAL | Status: DC

## 2011-04-26 ENCOUNTER — Encounter: Payer: Self-pay | Admitting: Infectious Disease

## 2011-04-26 ENCOUNTER — Ambulatory Visit (INDEPENDENT_AMBULATORY_CARE_PROVIDER_SITE_OTHER): Payer: 59 | Admitting: Infectious Disease

## 2011-04-26 VITALS — BP 132/84 | HR 56 | Temp 97.6°F | Wt 176.0 lb

## 2011-04-26 DIAGNOSIS — Z21 Asymptomatic human immunodeficiency virus [HIV] infection status: Secondary | ICD-10-CM

## 2011-04-26 DIAGNOSIS — F17200 Nicotine dependence, unspecified, uncomplicated: Secondary | ICD-10-CM

## 2011-04-26 DIAGNOSIS — F329 Major depressive disorder, single episode, unspecified: Secondary | ICD-10-CM

## 2011-04-26 DIAGNOSIS — B20 Human immunodeficiency virus [HIV] disease: Secondary | ICD-10-CM

## 2011-04-26 DIAGNOSIS — I10 Essential (primary) hypertension: Secondary | ICD-10-CM

## 2011-04-26 DIAGNOSIS — Z23 Encounter for immunization: Secondary | ICD-10-CM

## 2011-04-26 NOTE — Assessment & Plan Note (Signed)
Offered smoking cessation counseling and tools patient still not ready to stop

## 2011-04-26 NOTE — Assessment & Plan Note (Signed)
Stable off meds. ?

## 2011-04-26 NOTE — Progress Notes (Signed)
  Subjective:    Patient ID: Ryan Rich, male    DOB: 03/12/1966, 45 y.o.   MRN: [id number]  HPI  45 year old Caucasian male with HIV supremely well-controlled while taking Atripla. His viral loads undetectable CD4 count is very healthy. He continues to smoke approximately 5-6 cigarettes a day. I counseled him to stop smoking and offered various smoking cessation strategies but is not yet interested in doing this. He has not received a flu shot for several years but after my going into detail why he should receive influenza vaccine he eventually agreed to influenza vaccination today. 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 His vital section active this point time.  Review of Systems  Constitutional: Negative for fever, chills, diaphoresis, activity change, appetite change, fatigue and unexpected weight change.  HENT: Negative for congestion, sore throat, rhinorrhea, sneezing, trouble swallowing and sinus pressure.   Eyes: Negative for photophobia and visual disturbance.  Respiratory: Negative for cough, chest tightness, shortness of breath, wheezing and stridor.   Cardiovascular: Negative for chest pain, palpitations and leg swelling.  Gastrointestinal: Negative for nausea, vomiting, abdominal pain, diarrhea, constipation, blood in stool, abdominal distention and anal bleeding.  Genitourinary: Negative for dysuria, hematuria, flank pain and difficulty urinating.  Musculoskeletal: Negative for myalgias, back pain, joint swelling, arthralgias and gait problem.  Skin: Negative for color change, pallor, rash and wound.  Neurological: Negative for dizziness, tremors, weakness and light-headedness.  Hematological: Negative for adenopathy. Does not bruise/bleed easily.  Psychiatric/Behavioral: Negative for behavioral problems, confusion, sleep disturbance, dysphoric mood, decreased concentration and agitation.       Objective:   Physical  Exam  Constitutional: He is oriented to person, place, and time. He appears well-developed and well-nourished. No distress.  HENT:  Head: Normocephalic and atraumatic.  Mouth/Throat: Oropharynx is clear and moist. No oropharyngeal exudate.  Eyes: Conjunctivae and EOM are normal. Pupils are equal, round, and reactive to light. No scleral icterus.  Neck: Normal range of motion. Neck supple. No JVD present.  Cardiovascular: Normal rate, regular rhythm and normal heart sounds.  Exam reveals no gallop and no friction rub.   No murmur heard. Pulmonary/Chest: Effort normal and breath sounds normal. No respiratory distress. He has no wheezes. He has no rales. He exhibits no tenderness.  Abdominal: He exhibits no distension and no mass. There is no tenderness. There is no rebound and no guarding.  Musculoskeletal: He exhibits no edema and no tenderness.  Lymphadenopathy:    He has no cervical adenopathy.  Neurological: He is alert and oriented to person, place, and time. He has normal reflexes. He exhibits normal muscle tone. Coordination normal.  Skin: Skin is warm and dry. He is not diaphoretic. No erythema. No pallor.  Psychiatric: He has a normal mood and affect. His behavior is normal. Judgment and thought content normal.          Assessment & Plan:  HIV DISEASE Continue Atripla  TOBACCO USER Offered smoking cessation counseling and tools patient still not ready to stop  DEPRESSION Stable off meds.  ESSENTIAL HYPERTENSION, BENIGN Blood pressure is stable but I think we should check a microalbumin and creatinine ratio in future   \

## 2011-04-26 NOTE — Assessment & Plan Note (Signed)
Continue Atripla 

## 2011-04-26 NOTE — Assessment & Plan Note (Signed)
Blood pressure is stable but I think we should check a microalbumin and creatinine ratio in future

## 2011-04-27 ENCOUNTER — Other Ambulatory Visit: Payer: Self-pay | Admitting: *Deleted

## 2011-04-27 DIAGNOSIS — B20 Human immunodeficiency virus [HIV] disease: Secondary | ICD-10-CM

## 2011-04-27 MED ORDER — EFAVIRENZ-EMTRICITAB-TENOFOVIR 600-200-300 MG PO TABS: 1.0000 | ORAL_TABLET | Freq: Every day | ORAL | Status: DC

## 2011-06-14 ENCOUNTER — Ambulatory Visit (INDEPENDENT_AMBULATORY_CARE_PROVIDER_SITE_OTHER): Payer: 59 | Admitting: *Deleted

## 2011-06-14 ENCOUNTER — Encounter: Payer: Self-pay | Admitting: *Deleted

## 2011-06-14 VITALS — BP 130/87 | HR 52 | Temp 97.5°F | Wt 178.2 lb

## 2011-06-14 DIAGNOSIS — B20 Human immunodeficiency virus [HIV] disease: Secondary | ICD-10-CM

## 2011-06-14 LAB — COMPREHENSIVE METABOLIC PANEL
Albumin: 4.6 g/dL (ref 3.5–5.2)
CO2: 25 mEq/L (ref 19–32)
Calcium: 9.5 mg/dL (ref 8.4–10.5)
Glucose, Bld: 96 mg/dL (ref 70–99)
Potassium: 3.8 mEq/L (ref 3.5–5.3)
Sodium: 139 mEq/L (ref 135–145)
Total Protein: 7.2 g/dL (ref 6.0–8.3)

## 2011-06-14 LAB — LIPID PANEL
Cholesterol: 150 mg/dL (ref 0–200)
Triglycerides: 207 mg/dL — ABNORMAL HIGH (ref <150)

## 2011-06-14 NOTE — Progress Notes (Signed)
06/14/11 @ 0900: Pt here for research study A5001, week 688. Pt denies any new findings or problems. Assessment unchanged since last study visit. Vital signs stable; fasting labs drawn; Pt received $50 gift card for study visit. Next appointment scheduled for Monday, October 03, 2010. -- Tacey Heap RN II

## 2011-07-06 ENCOUNTER — Other Ambulatory Visit: Payer: Self-pay | Admitting: *Deleted

## 2011-07-06 DIAGNOSIS — B20 Human immunodeficiency virus [HIV] disease: Secondary | ICD-10-CM

## 2011-07-06 MED ORDER — EFAVIRENZ-EMTRICITAB-TENOFOVIR 600-200-300 MG PO TABS: 1.0000 | ORAL_TABLET | Freq: Every day | ORAL | Status: DC

## 2011-07-07 ENCOUNTER — Encounter: Payer: Self-pay | Admitting: Infectious Disease

## 2011-07-07 LAB — CD4/CD8 (T-HELPER/T-SUPPRESSOR CELL)
CD4: 1020
CD8 % Suppressor T Cell: 38.8

## 2011-07-12 ENCOUNTER — Other Ambulatory Visit: Payer: Self-pay | Admitting: *Deleted

## 2011-10-04 ENCOUNTER — Ambulatory Visit (INDEPENDENT_AMBULATORY_CARE_PROVIDER_SITE_OTHER): Payer: 59 | Admitting: *Deleted

## 2011-10-04 VITALS — BP 123/85 | HR 69 | Temp 97.7°F | Wt 186.2 lb

## 2011-10-04 DIAGNOSIS — B20 Human immunodeficiency virus [HIV] disease: Secondary | ICD-10-CM

## 2011-10-04 LAB — LIPID PANEL: LDL Cholesterol: 90 mg/dL (ref 0–99)

## 2011-10-04 LAB — CBC WITH DIFFERENTIAL/PLATELET
Basophils Absolute: 0 10*3/uL (ref 0.0–0.1)
HCT: 46.3 % (ref 39.0–52.0)
Lymphocytes Relative: 36 % (ref 12–46)
Monocytes Absolute: 0.7 10*3/uL (ref 0.1–1.0)
Neutro Abs: 3.2 10*3/uL (ref 1.7–7.7)
Neutrophils Relative %: 50 % (ref 43–77)
RDW: 13 % (ref 11.5–15.5)
WBC: 6.5 10*3/uL (ref 4.0–10.5)

## 2011-10-04 LAB — COMPREHENSIVE METABOLIC PANEL
ALT: 25 U/L (ref 0–53)
AST: 27 U/L (ref 0–37)
Albumin: 4.4 g/dL (ref 3.5–5.2)
BUN: 13 mg/dL (ref 6–23)
Calcium: 9 mg/dL (ref 8.4–10.5)
Chloride: 104 mEq/L (ref 96–112)
Potassium: 3.8 mEq/L (ref 3.5–5.3)
Sodium: 140 mEq/L (ref 135–145)
Total Protein: 7.2 g/dL (ref 6.0–8.3)

## 2011-10-04 NOTE — Progress Notes (Signed)
Pt here for study A5001, week 704. Assessment unchanged since last study visit. Pt denies any new changes, problems, or symptoms. Informed patient of study closure and that he would have 2 more visits. Reviewed the letter to participants and pt signed letter after he verbalized understanding. Fasting labs were drawn; vital signs stable; pt received $50.00 gift card for study visit. Next appointment scheduled for Monday, Jan 03, 2012 @ 9am. Tacey Heap RN

## 2012-01-03 ENCOUNTER — Ambulatory Visit (INDEPENDENT_AMBULATORY_CARE_PROVIDER_SITE_OTHER): Payer: 59 | Admitting: *Deleted

## 2012-01-03 ENCOUNTER — Encounter: Payer: Self-pay | Admitting: *Deleted

## 2012-01-03 VITALS — BP 126/85 | HR 61 | Temp 97.7°F | Wt 181.8 lb

## 2012-01-03 DIAGNOSIS — B20 Human immunodeficiency virus [HIV] disease: Secondary | ICD-10-CM

## 2012-01-03 LAB — LIPID PANEL
Cholesterol: 174 mg/dL (ref 0–200)
LDL Cholesterol: 98 mg/dL (ref 0–99)
Triglycerides: 215 mg/dL — ABNORMAL HIGH (ref <150)

## 2012-01-03 LAB — COMPREHENSIVE METABOLIC PANEL
ALT: 25 U/L (ref 0–53)
Albumin: 4.7 g/dL (ref 3.5–5.2)
CO2: 27 mEq/L (ref 19–32)
Chloride: 106 mEq/L (ref 96–112)
Glucose, Bld: 95 mg/dL (ref 70–99)
Potassium: 4 mEq/L (ref 3.5–5.3)
Sodium: 140 mEq/L (ref 135–145)
Total Protein: 7.2 g/dL (ref 6.0–8.3)

## 2012-01-03 LAB — CD4/CD8 (T-HELPER/T-SUPPRESSOR CELL)
CD4%: 41.7
CD4: 792
CD8 % Suppressor T Cell: 36.8

## 2012-01-03 LAB — HIV-1 RNA QUANT-NO REFLEX-BLD: HIV-1 RNA Viral Load: <40

## 2012-01-03 LAB — CREATININE, URINE, RANDOM: Creatinine, Urine: 59 mg/dL

## 2012-01-03 NOTE — Progress Notes (Signed)
Pt here for A5001, week 720. Assessment unchanged since last study visit. Fasting labs were drawn; Vital signs stable. Pt received $50 gift card for visit and transportation. Last study visit is scheduled for Monday, May 01, 2012 @ 9am. Tacey Heap RN

## 2012-01-24 ENCOUNTER — Encounter: Payer: Self-pay | Admitting: Infectious Disease

## 2012-01-31 ENCOUNTER — Telehealth: Payer: Self-pay | Admitting: *Deleted

## 2012-01-31 NOTE — Telephone Encounter (Signed)
I left a message asking him to call back & make an appt as he is due.

## 2012-03-20 ENCOUNTER — Ambulatory Visit (INDEPENDENT_AMBULATORY_CARE_PROVIDER_SITE_OTHER): Payer: 59 | Admitting: *Deleted

## 2012-03-20 VITALS — BP 126/88 | HR 68 | Temp 97.6°F | Wt 182.2 lb

## 2012-03-20 DIAGNOSIS — B20 Human immunodeficiency virus [HIV] disease: Secondary | ICD-10-CM

## 2012-03-20 LAB — LIPID PANEL: Total CHOL/HDL Ratio: 5.2 Ratio

## 2012-03-20 LAB — COMPREHENSIVE METABOLIC PANEL
ALT: 25 U/L (ref 0–53)
BUN: 13 mg/dL (ref 6–23)
CO2: 24 mEq/L (ref 19–32)
Calcium: 9.8 mg/dL (ref 8.4–10.5)
Creat: 0.81 mg/dL (ref 0.50–1.35)
Total Bilirubin: 0.4 mg/dL (ref 0.3–1.2)

## 2012-03-20 NOTE — Progress Notes (Signed)
Pt here for FINAL A5001 visit. Assessment unchanged since last study visit. Pt denies any new problems, symptoms, or changes. Adhering to Atripla regimen. Fasting labs were drawn; vital signs remain stable. Pt received $50.00 for study visit. Pt is eligible for A5322 study however it does not cover CD4 and VL cost so he might not enroll in this study. Told him we would contact him when study opened to see how he felt. Tacey Heap RN

## 2012-03-21 LAB — PROTEIN, URINE, RANDOM: Total Protein, Urine: <3 mg/dL

## 2012-03-21 LAB — CREATININE, URINE, RANDOM: Creatinine, Urine: 60.6 mg/dL

## 2012-04-06 LAB — HIV-1 RNA QUANT-NO REFLEX-BLD: HIV 1 RNA Quant: <40

## 2012-04-06 NOTE — Addendum Note (Signed)
Addended by: Phill Myron on: 04/06/2012 11:19 AM   Modules accepted: Orders

## 2012-05-08 ENCOUNTER — Encounter: Payer: Self-pay | Admitting: Infectious Disease

## 2012-05-08 ENCOUNTER — Ambulatory Visit (INDEPENDENT_AMBULATORY_CARE_PROVIDER_SITE_OTHER): Payer: 59 | Admitting: Infectious Disease

## 2012-05-08 VITALS — BP 125/88 | HR 60 | Temp 97.5°F | Ht 70.0 in | Wt 181.0 lb

## 2012-05-08 DIAGNOSIS — B20 Human immunodeficiency virus [HIV] disease: Secondary | ICD-10-CM

## 2012-05-08 DIAGNOSIS — F17200 Nicotine dependence, unspecified, uncomplicated: Secondary | ICD-10-CM

## 2012-05-08 DIAGNOSIS — I10 Essential (primary) hypertension: Secondary | ICD-10-CM

## 2012-05-08 DIAGNOSIS — Z21 Asymptomatic human immunodeficiency virus [HIV] infection status: Secondary | ICD-10-CM

## 2012-05-08 DIAGNOSIS — Z23 Encounter for immunization: Secondary | ICD-10-CM

## 2012-05-08 MED ORDER — EFAVIRENZ-EMTRICITAB-TENOFOVIR 600-200-300 MG PO TABS: 1.0000 | ORAL_TABLET | Freq: Every day | ORAL | Status: DC

## 2012-05-08 NOTE — Assessment & Plan Note (Signed)
Perfect control!. Recheck labs in 6 months, 12 months rtc in one year. Flu shot today

## 2012-05-08 NOTE — Progress Notes (Signed)
  Subjective:    Patient ID: Ryan Rich, male    DOB: September 23, 1965, 46 y.o.   MRN: [id number]  HPI  Ryan Rich is a 46 y.o. male who is doing superbly well on his  antiviral regimen, atripla with undetectable viral load and health cd4 count. I spent greater than 45 minutes with the patient including greater than 50% of time in face to face counsel of the patient and in coordination of their care--the bulk of which was my engagment to encourage him to take the quadravalent influenza vaccine and to stop smoking. Otherwise he is doing very well. He is not sexually active and is not in need of condoms.   Review of Systems  Constitutional: Negative for fever, chills, diaphoresis, activity change, appetite change, fatigue and unexpected weight change.  HENT: Positive for congestion. Negative for sore throat, rhinorrhea, sneezing, trouble swallowing and sinus pressure.   Eyes: Negative for photophobia and visual disturbance.  Respiratory: Negative for cough, chest tightness, shortness of breath, wheezing and stridor.   Cardiovascular: Negative for chest pain, palpitations and leg swelling.  Gastrointestinal: Negative for nausea, vomiting, abdominal pain, diarrhea, constipation, blood in stool, abdominal distention and anal bleeding.  Genitourinary: Negative for dysuria, hematuria, flank pain and difficulty urinating.  Musculoskeletal: Negative for myalgias, back pain, joint swelling, arthralgias and gait problem.  Skin: Negative for color change, pallor, rash and wound.  Neurological: Negative for dizziness, tremors, weakness and light-headedness.  Hematological: Negative for adenopathy. Does not bruise/bleed easily.  Psychiatric/Behavioral: Negative for behavioral problems, confusion, disturbed wake/sleep cycle, dysphoric mood, decreased concentration and agitation.       Objective:   Physical Exam  Constitutional: He is oriented to person, place, and time. He appears well-developed and  well-nourished. No distress.  HENT:  Head: Normocephalic and atraumatic.  Mouth/Throat: Oropharynx is clear and moist. No oropharyngeal exudate.  Eyes: Conjunctivae normal and EOM are normal. Pupils are equal, round, and reactive to light. No scleral icterus.  Neck: Normal range of motion. Neck supple. No JVD present.  Cardiovascular: Normal rate, regular rhythm and normal heart sounds.  Exam reveals no gallop and no friction rub.   No murmur heard. Pulmonary/Chest: Effort normal and breath sounds normal. No respiratory distress. He has no wheezes. He has no rales. He exhibits no tenderness.  Abdominal: He exhibits no distension and no mass. There is no tenderness. There is no rebound and no guarding.  Musculoskeletal: He exhibits no edema and no tenderness.  Lymphadenopathy:    He has no cervical adenopathy.  Neurological: He is alert and oriented to person, place, and time. He has normal reflexes. He exhibits normal muscle tone. Coordination normal.  Skin: Skin is warm and dry. He is not diaphoretic. No erythema. No pallor.  Psychiatric: He has a normal mood and affect. His behavior is normal. Judgment and thought content normal.          Assessment & Plan:  HIV DISEASE Perfect control!. Recheck labs in 6 months, 12 months rtc in one year. Flu shot today  ESSENTIAL HYPERTENSION, BENIGN Well controlled   TOBACCO USER counselled to quit

## 2012-05-08 NOTE — Assessment & Plan Note (Signed)
counselled to quit 

## 2012-05-08 NOTE — Assessment & Plan Note (Signed)
Well controlled 

## 2012-06-16 ENCOUNTER — Other Ambulatory Visit: Payer: Self-pay | Admitting: *Deleted

## 2012-06-16 DIAGNOSIS — B20 Human immunodeficiency virus [HIV] disease: Secondary | ICD-10-CM

## 2012-06-16 DIAGNOSIS — Z21 Asymptomatic human immunodeficiency virus [HIV] infection status: Secondary | ICD-10-CM

## 2012-06-16 MED ORDER — EFAVIRENZ-EMTRICITAB-TENOFOVIR 600-200-300 MG PO TABS: 1.0000 | ORAL_TABLET | Freq: Every day | ORAL | Status: DC

## 2012-11-06 ENCOUNTER — Other Ambulatory Visit (INDEPENDENT_AMBULATORY_CARE_PROVIDER_SITE_OTHER): Payer: 59

## 2012-11-06 ENCOUNTER — Other Ambulatory Visit: Payer: Self-pay | Admitting: Infectious Disease

## 2012-11-06 DIAGNOSIS — Z21 Asymptomatic human immunodeficiency virus [HIV] infection status: Secondary | ICD-10-CM

## 2012-11-06 DIAGNOSIS — B20 Human immunodeficiency virus [HIV] disease: Secondary | ICD-10-CM

## 2012-11-06 LAB — CBC WITH DIFFERENTIAL/PLATELET
Eosinophils Relative: 4 % (ref 0–5)
HCT: 44.5 % (ref 39.0–52.0)
Lymphocytes Relative: 36 % (ref 12–46)
Lymphs Abs: 2.1 10*3/uL (ref 0.7–4.0)
MCV: 87.8 fL (ref 78.0–100.0)
Neutro Abs: 2.9 10*3/uL (ref 1.7–7.7)
Platelets: 229 10*3/uL (ref 150–400)
RBC: 5.07 MIL/uL (ref 4.22–5.81)
WBC: 5.7 10*3/uL (ref 4.0–10.5)

## 2012-11-06 LAB — COMPLETE METABOLIC PANEL WITH GFR
Albumin: 4.5 g/dL (ref 3.5–5.2)
BUN: 13 mg/dL (ref 6–23)
CO2: 26 mEq/L (ref 19–32)
GFR, Est African American: >89 mL/min
GFR, Est Non African American: >89 mL/min
Glucose, Bld: 100 mg/dL — ABNORMAL HIGH (ref 70–99)
Potassium: 3.8 mEq/L (ref 3.5–5.3)
Sodium: 138 mEq/L (ref 135–145)
Total Bilirubin: 0.3 mg/dL (ref 0.3–1.2)
Total Protein: 7.1 g/dL (ref 6.0–8.3)

## 2012-11-07 LAB — T.PALLIDUM AB, TOTAL: T pallidum Antibodies (TP-PA): >8 S/CO — ABNORMAL HIGH (ref <0.90)

## 2012-11-07 LAB — HIV-1 RNA QUANT-NO REFLEX-BLD: HIV 1 RNA Quant: <20 copies/mL (ref <20)

## 2012-11-20 ENCOUNTER — Ambulatory Visit: Payer: 59 | Admitting: Infectious Disease

## 2012-11-20 ENCOUNTER — Ambulatory Visit (INDEPENDENT_AMBULATORY_CARE_PROVIDER_SITE_OTHER): Payer: 59 | Admitting: Infectious Disease

## 2012-11-20 ENCOUNTER — Encounter: Payer: Self-pay | Admitting: Infectious Disease

## 2012-11-20 VITALS — BP 128/87 | HR 64 | Temp 97.9°F | Ht 70.0 in | Wt 178.0 lb

## 2012-11-20 DIAGNOSIS — B20 Human immunodeficiency virus [HIV] disease: Secondary | ICD-10-CM

## 2012-11-20 NOTE — Progress Notes (Signed)
  Subjective:    Patient ID: Ryan Rich, male    DOB: [birth year], 47 y.o.   MRN: [id number]  HPI   Ryan Rich is a 47 y.o. male who is doing superbly well on his  antiviral regimen, atripla with undetectable viral load and health cd4 count. He does continue to smoke 0.5 pack per day of tobacco but does not want to smoke aty this  Point. Is stressed from moving. Occ vivid dreams with Atripla if he eats late no other issues.  Offered him consideration for STRIIVING switch study but is enjoying not coming intot clinic so frequently.  Review of Systems  Constitutional: Negative for fever, chills, diaphoresis, activity change, appetite change, fatigue and unexpected weight change.  HENT: Positive for congestion. Negative for sore throat, rhinorrhea, sneezing, trouble swallowing and sinus pressure.   Eyes: Negative for photophobia and visual disturbance.  Respiratory: Negative for cough, chest tightness, shortness of breath, wheezing and stridor.   Cardiovascular: Negative for chest pain, palpitations and leg swelling.  Gastrointestinal: Negative for nausea, vomiting, abdominal pain, diarrhea, constipation, blood in stool, abdominal distention and anal bleeding.  Genitourinary: Negative for dysuria, hematuria, flank pain and difficulty urinating.  Musculoskeletal: Negative for myalgias, back pain, joint swelling, arthralgias and gait problem.  Skin: Negative for color change, pallor, rash and wound.  Neurological: Negative for dizziness, tremors, weakness and light-headedness.  Hematological: Negative for adenopathy. Does not bruise/bleed easily.  Psychiatric/Behavioral: Negative for behavioral problems, confusion, sleep disturbance, dysphoric mood, decreased concentration and agitation.       Objective:   Physical Exam  Constitutional: He is oriented to person, place, and time. He appears well-developed and well-nourished. No distress.  HENT:  Head: Normocephalic and atraumatic.   Mouth/Throat: Oropharynx is clear and moist. No oropharyngeal exudate.  Eyes: Conjunctivae and EOM are normal. Pupils are equal, round, and reactive to light. No scleral icterus.  Neck: Normal range of motion. Neck supple. No JVD present.  Cardiovascular: Normal rate, regular rhythm and normal heart sounds.  Exam reveals no gallop and no friction rub.   No murmur heard. Pulmonary/Chest: Effort normal and breath sounds normal. No respiratory distress. He has no wheezes. He has no rales. He exhibits no tenderness.  Abdominal: He exhibits no distension and no mass. There is no tenderness. There is no rebound and no guarding.  Musculoskeletal: He exhibits no edema and no tenderness.  Lymphadenopathy:    He has no cervical adenopathy.  Neurological: He is alert and oriented to person, place, and time. He has normal reflexes. He exhibits normal muscle tone. Coordination normal.  Skin: Skin is warm and dry. He is not diaphoretic. No erythema. No pallor.  Psychiatric: He has a normal mood and affect. His behavior is normal. Judgment and thought content normal.          Assessment & Plan:  HIV: continue Atripla, rtc in 6 months  Smoking: continue to work on this

## 2013-03-26 ENCOUNTER — Encounter: Payer: Self-pay | Admitting: Infectious Disease

## 2013-05-21 ENCOUNTER — Other Ambulatory Visit: Payer: BC Managed Care – PPO

## 2013-05-21 DIAGNOSIS — B20 Human immunodeficiency virus [HIV] disease: Secondary | ICD-10-CM

## 2013-05-21 LAB — CBC WITH DIFFERENTIAL/PLATELET
Basophils Relative: 0 % (ref 0–1)
Eosinophils Absolute: 0.2 10*3/uL (ref 0.0–0.7)
Hemoglobin: 16 g/dL (ref 13.0–17.0)
MCHC: 35 g/dL (ref 30.0–36.0)
Monocytes Relative: 10 % (ref 3–12)
Neutro Abs: 3.3 10*3/uL (ref 1.7–7.7)
Neutrophils Relative %: 54 % (ref 43–77)
Platelets: 212 10*3/uL (ref 150–400)
RBC: 5.1 MIL/uL (ref 4.22–5.81)

## 2013-05-21 LAB — LIPID PANEL
LDL Cholesterol: 106 mg/dL — ABNORMAL HIGH (ref 0–99)
Total CHOL/HDL Ratio: 4.8 Ratio
Triglycerides: 105 mg/dL (ref <150)
VLDL: 21 mg/dL (ref 0–40)

## 2013-05-21 LAB — COMPLETE METABOLIC PANEL WITH GFR
ALT: 24 U/L (ref 0–53)
AST: 22 U/L (ref 0–37)
Albumin: 4.5 g/dL (ref 3.5–5.2)
Alkaline Phosphatase: 111 U/L (ref 39–117)
Calcium: 9.6 mg/dL (ref 8.4–10.5)
Chloride: 107 mEq/L (ref 96–112)
Potassium: 4.3 mEq/L (ref 3.5–5.3)

## 2013-05-21 LAB — RPR: RPR Ser Ql: REACTIVE — AB

## 2013-05-22 LAB — T-HELPER CELL (CD4) - (RCID CLINIC ONLY)
CD4 % Helper T Cell: 36 % (ref 33–55)
CD4 T Cell Abs: 760 /uL (ref 400–2700)

## 2013-05-23 LAB — HIV-1 RNA QUANT-NO REFLEX-BLD
HIV 1 RNA Quant: <20 copies/mL (ref <20)
HIV-1 RNA Quant, Log: <1.3 {Log} (ref <1.30)

## 2013-06-04 ENCOUNTER — Ambulatory Visit (INDEPENDENT_AMBULATORY_CARE_PROVIDER_SITE_OTHER): Payer: BC Managed Care – PPO | Admitting: Infectious Disease

## 2013-06-04 ENCOUNTER — Encounter: Payer: Self-pay | Admitting: Infectious Disease

## 2013-06-04 ENCOUNTER — Ambulatory Visit: Payer: 59 | Admitting: Infectious Disease

## 2013-06-04 VITALS — BP 125/78 | HR 62 | Temp 98.0°F | Ht 70.0 in | Wt 180.0 lb

## 2013-06-04 DIAGNOSIS — F17200 Nicotine dependence, unspecified, uncomplicated: Secondary | ICD-10-CM

## 2013-06-04 DIAGNOSIS — Z23 Encounter for immunization: Secondary | ICD-10-CM

## 2013-06-04 DIAGNOSIS — B20 Human immunodeficiency virus [HIV] disease: Secondary | ICD-10-CM

## 2013-06-04 NOTE — Progress Notes (Signed)
  Subjective:    Patient ID: Ryan Rich, male    DOB: 25-Nov-1965, 47 y.o.   MRN: [id number]  HPI   Ryan Rich is a 47 y.o. male who is doing superbly well on his  antiviral regimen, atripla with undetectable viral load and health cd4 count. He does continue to smoke 0.5 pack per day of tobacco but does not want to stop smoking at this point.. Occ vivid dreams with Atripla if he eats late no other issues. Occasionally has "foggy" feeling with awakenings.  I discussed other STR but he would like to remain on the current regimen.   Review of Systems  Constitutional: Negative for fever, chills, diaphoresis, activity change, appetite change, fatigue and unexpected weight change.  HENT: Negative for congestion, rhinorrhea, sinus pressure, sneezing, sore throat and trouble swallowing.   Eyes: Negative for photophobia and visual disturbance.  Respiratory: Negative for cough, chest tightness, shortness of breath, wheezing and stridor.   Cardiovascular: Negative for chest pain, palpitations and leg swelling.  Gastrointestinal: Negative for nausea, vomiting, abdominal pain, diarrhea, constipation, blood in stool, abdominal distention and anal bleeding.  Genitourinary: Negative for dysuria, hematuria, flank pain and difficulty urinating.  Musculoskeletal: Negative for arthralgias, back pain, gait problem, joint swelling and myalgias.  Skin: Negative for color change, pallor, rash and wound.  Neurological: Negative for dizziness, tremors, weakness and light-headedness.  Hematological: Negative for adenopathy. Does not bruise/bleed easily.  Psychiatric/Behavioral: Negative for behavioral problems, confusion, sleep disturbance, dysphoric mood, decreased concentration and agitation.       Objective:   Physical Exam  Constitutional: He is oriented to person, place, and time. He appears well-developed and well-nourished. No distress.  HENT:  Head: Normocephalic and atraumatic.  Mouth/Throat:  Oropharynx is clear and moist. No oropharyngeal exudate.  Eyes: Conjunctivae and EOM are normal. Pupils are equal, round, and reactive to light. No scleral icterus.  Neck: Normal range of motion. Neck supple. No JVD present.  Cardiovascular: Normal rate, regular rhythm and normal heart sounds.  Exam reveals no gallop and no friction rub.   No murmur heard. Pulmonary/Chest: Effort normal and breath sounds normal. No respiratory distress. He has no wheezes. He has no rales. He exhibits no tenderness.  Abdominal: He exhibits no distension and no mass. There is no tenderness. There is no rebound and no guarding.  Musculoskeletal: He exhibits no edema and no tenderness.  Lymphadenopathy:    He has no cervical adenopathy.  Neurological: He is alert and oriented to person, place, and time. He has normal reflexes. He exhibits normal muscle tone. Coordination normal.  Skin: Skin is warm and dry. He is not diaphoretic. No erythema. No pallor.  Psychiatric: He has a normal mood and affect. His behavior is normal. Judgment and thought content normal.          Assessment & Plan:  HIV: continue Atripla, rtc in 6 months  Smoking: continue to work on this   HCM: flu shot

## 2013-06-21 ENCOUNTER — Other Ambulatory Visit: Payer: Self-pay

## 2013-12-03 ENCOUNTER — Other Ambulatory Visit: Payer: BC Managed Care – PPO

## 2013-12-03 DIAGNOSIS — Z113 Encounter for screening for infections with a predominantly sexual mode of transmission: Secondary | ICD-10-CM

## 2013-12-03 DIAGNOSIS — B20 Human immunodeficiency virus [HIV] disease: Secondary | ICD-10-CM

## 2013-12-03 DIAGNOSIS — Z79899 Other long term (current) drug therapy: Secondary | ICD-10-CM

## 2013-12-03 LAB — LIPID PANEL
CHOL/HDL RATIO: 4.3 ratio
CHOLESTEROL: 143 mg/dL (ref 0–200)
HDL: 33 mg/dL — ABNORMAL LOW (ref >39)
LDL CALC: 88 mg/dL (ref 0–99)
Triglycerides: 110 mg/dL (ref <150)
VLDL: 22 mg/dL (ref 0–40)

## 2013-12-03 LAB — CBC WITH DIFFERENTIAL/PLATELET
BASOS ABS: 0 10*3/uL (ref 0.0–0.1)
BASOS PCT: 0 % (ref 0–1)
Eosinophils Absolute: 0.2 10*3/uL (ref 0.0–0.7)
Eosinophils Relative: 3 % (ref 0–5)
HCT: 44.1 % (ref 39.0–52.0)
Hemoglobin: 15.6 g/dL (ref 13.0–17.0)
LYMPHS PCT: 31 % (ref 12–46)
Lymphs Abs: 1.8 10*3/uL (ref 0.7–4.0)
MCH: 31 pg (ref 26.0–34.0)
MCHC: 35.4 g/dL (ref 30.0–36.0)
MCV: 87.5 fL (ref 78.0–100.0)
MONO ABS: 0.6 10*3/uL (ref 0.1–1.0)
Monocytes Relative: 11 % (ref 3–12)
NEUTROS ABS: 3.1 10*3/uL (ref 1.7–7.7)
Neutrophils Relative %: 55 % (ref 43–77)
PLATELETS: 206 10*3/uL (ref 150–400)
RBC: 5.04 MIL/uL (ref 4.22–5.81)
RDW: 13.4 % (ref 11.5–15.5)
WBC: 5.7 10*3/uL (ref 4.0–10.5)

## 2013-12-03 LAB — COMPLETE METABOLIC PANEL WITH GFR
ALK PHOS: 104 U/L (ref 39–117)
ALT: 24 U/L (ref 0–53)
AST: 21 U/L (ref 0–37)
Albumin: 4.2 g/dL (ref 3.5–5.2)
BUN: 10 mg/dL (ref 6–23)
CALCIUM: 9.1 mg/dL (ref 8.4–10.5)
CHLORIDE: 105 meq/L (ref 96–112)
CO2: 27 mEq/L (ref 19–32)
Creat: 0.74 mg/dL (ref 0.50–1.35)
GFR, Est African American: >89 mL/min
GFR, Est Non African American: >89 mL/min
Glucose, Bld: 99 mg/dL (ref 70–99)
Potassium: 4.1 mEq/L (ref 3.5–5.3)
Sodium: 140 mEq/L (ref 135–145)
Total Bilirubin: 0.5 mg/dL (ref 0.2–1.2)
Total Protein: 6.8 g/dL (ref 6.0–8.3)

## 2013-12-03 LAB — RPR TITER: RPR Titer: 1:2 {titer}

## 2013-12-03 LAB — RPR: RPR Ser Ql: REACTIVE — AB

## 2013-12-04 LAB — HIV-1 RNA QUANT-NO REFLEX-BLD: HIV 1 RNA Quant: <20 copies/mL (ref <20)

## 2013-12-04 LAB — T-HELPER CELL (CD4) - (RCID CLINIC ONLY)
CD4 T CELL HELPER: 40 % (ref 33–55)
CD4 T Cell Abs: 790 /uL (ref 400–2700)

## 2013-12-04 LAB — T.PALLIDUM AB, TOTAL

## 2013-12-24 ENCOUNTER — Ambulatory Visit: Payer: BC Managed Care – PPO | Admitting: Infectious Disease

## 2013-12-31 ENCOUNTER — Ambulatory Visit (INDEPENDENT_AMBULATORY_CARE_PROVIDER_SITE_OTHER): Payer: BC Managed Care – PPO | Admitting: Infectious Disease

## 2013-12-31 ENCOUNTER — Encounter: Payer: Self-pay | Admitting: Infectious Disease

## 2013-12-31 VITALS — BP 150/95 | HR 58 | Temp 98.1°F | Wt 181.0 lb

## 2013-12-31 DIAGNOSIS — IMO0001 Reserved for inherently not codable concepts without codable children: Secondary | ICD-10-CM

## 2013-12-31 DIAGNOSIS — B20 Human immunodeficiency virus [HIV] disease: Secondary | ICD-10-CM

## 2013-12-31 DIAGNOSIS — I10 Essential (primary) hypertension: Secondary | ICD-10-CM

## 2013-12-31 DIAGNOSIS — Z87898 Personal history of other specified conditions: Secondary | ICD-10-CM

## 2013-12-31 DIAGNOSIS — Z0000 Encounter for general adult medical examination without abnormal findings: Secondary | ICD-10-CM

## 2013-12-31 DIAGNOSIS — Z8719 Personal history of other diseases of the digestive system: Secondary | ICD-10-CM

## 2013-12-31 NOTE — Progress Notes (Signed)
Subjective:    Patient ID: Ryan Rich, male    DOB: [birth year], 48 y.o.   MRN: [id number]  HPI   Ryan Rich is a 48 y.o. male who is doing superbly well on his  antiviral regimen, atripla with undetectable viral load and health cd4 count. He does continue to smoke 0.5 pack per day of tobacco but does not want to stop smoking at this point.. Occ vivid dreams with Atripla if he eats late no other issues. Occasionally has "foggy" feeling with awakenings.  I discussed other STR at last visit but he would like to remain on the current regimen.   Reviewed his CV risk factors and his BP 150 being up increased his risk we are rechecking BP  Which was 140s but he is under stress going to dentist for crown with high copay.  Emphasized need for smoking cessation. Also offered referral to our REPRIEVE study.  Review of Systems  Constitutional: Negative for fever, chills, diaphoresis, activity change, appetite change, fatigue and unexpected weight change.  HENT: Negative for congestion, rhinorrhea, sinus pressure, sneezing, sore throat and trouble swallowing.   Eyes: Negative for photophobia and visual disturbance.  Respiratory: Negative for cough, chest tightness, shortness of breath, wheezing and stridor.   Cardiovascular: Negative for chest pain, palpitations and leg swelling.  Gastrointestinal: Negative for nausea, vomiting, abdominal pain, diarrhea, constipation, blood in stool, abdominal distention and anal bleeding.  Genitourinary: Negative for dysuria, hematuria, flank pain and difficulty urinating.  Musculoskeletal: Negative for arthralgias, back pain, gait problem, joint swelling and myalgias.  Skin: Negative for color change, pallor, rash and wound.  Neurological: Negative for dizziness, tremors, weakness and light-headedness.  Hematological: Negative for adenopathy. Does not bruise/bleed easily.  Psychiatric/Behavioral: Negative for behavioral problems, confusion, sleep  disturbance, dysphoric mood, decreased concentration and agitation.       Objective:   Physical Exam  Constitutional: He is oriented to person, place, and time. He appears well-developed and well-nourished. No distress.  HENT:  Head: Normocephalic and atraumatic.  Mouth/Throat: Oropharynx is clear and moist. No oropharyngeal exudate.  Eyes: Conjunctivae and EOM are normal. Pupils are equal, round, and reactive to light. No scleral icterus.  Neck: Normal range of motion. Neck supple. No JVD present.  Cardiovascular: Normal rate, regular rhythm and normal heart sounds.  Exam reveals no gallop and no friction rub.   No murmur heard. Pulmonary/Chest: Effort normal and breath sounds normal. No respiratory distress. He has no wheezes. He has no rales. He exhibits no tenderness.  Abdominal: He exhibits no distension and no mass. There is no tenderness. There is no rebound and no guarding.  Musculoskeletal: He exhibits no edema and no tenderness.  Lymphadenopathy:    He has no cervical adenopathy.  Neurological: He is alert and oriented to person, place, and time. He has normal reflexes. He exhibits normal muscle tone. Coordination normal.  Skin: Skin is warm and dry. He is not diaphoretic. No erythema. No pallor.  Psychiatric: He has a normal mood and affect. His behavior is normal. Judgment and thought content normal.          Assessment & Plan:  HIV: continue Atripla, rtc in 6 months. I spent greater than 25 minutes with the patient including greater than 50% of time in face to face counsel of the patient and in coordination of their care.   Smoking: spent > 3 minutes counselling him re smoking cessation  HTN: largely due to pain, white coat htn and stress will  check microalbumin creatinine ratio and follow  Preventative; consider for REPRIEVE though he lives one hour away

## 2014-03-27 ENCOUNTER — Other Ambulatory Visit: Payer: Self-pay | Admitting: *Deleted

## 2014-03-27 DIAGNOSIS — B20 Human immunodeficiency virus [HIV] disease: Secondary | ICD-10-CM

## 2014-03-27 DIAGNOSIS — Z21 Asymptomatic human immunodeficiency virus [HIV] infection status: Secondary | ICD-10-CM

## 2014-03-27 MED ORDER — EFAVIRENZ-EMTRICITAB-TENOFOVIR 600-200-300 MG PO TABS: 1.0000 | ORAL_TABLET | Freq: Every day | ORAL | Status: DC

## 2014-05-31 ENCOUNTER — Other Ambulatory Visit: Payer: Self-pay

## 2014-07-01 ENCOUNTER — Other Ambulatory Visit: Payer: BC Managed Care – PPO

## 2014-07-01 DIAGNOSIS — B20 Human immunodeficiency virus [HIV] disease: Secondary | ICD-10-CM

## 2014-07-01 LAB — LIPID PANEL
CHOL/HDL RATIO: 4.7 ratio
CHOLESTEROL: 160 mg/dL (ref 0–200)
HDL: 34 mg/dL — ABNORMAL LOW (ref >39)
LDL Cholesterol: 105 mg/dL — ABNORMAL HIGH (ref 0–99)
TRIGLYCERIDES: 103 mg/dL (ref <150)
VLDL: 21 mg/dL (ref 0–40)

## 2014-07-01 LAB — CBC WITH DIFFERENTIAL/PLATELET
BASOS ABS: 0.1 10*3/uL (ref 0.0–0.1)
Basophils Relative: 1 % (ref 0–1)
EOS PCT: 4 % (ref 0–5)
Eosinophils Absolute: 0.3 10*3/uL (ref 0.0–0.7)
HCT: 44.3 % (ref 39.0–52.0)
Hemoglobin: 16.1 g/dL (ref 13.0–17.0)
LYMPHS PCT: 28 % (ref 12–46)
Lymphs Abs: 1.8 10*3/uL (ref 0.7–4.0)
MCH: 31.4 pg (ref 26.0–34.0)
MCHC: 36.3 g/dL — ABNORMAL HIGH (ref 30.0–36.0)
MCV: 86.5 fL (ref 78.0–100.0)
Monocytes Absolute: 0.7 10*3/uL (ref 0.1–1.0)
Monocytes Relative: 10 % (ref 3–12)
NEUTROS PCT: 57 % (ref 43–77)
Neutro Abs: 3.8 10*3/uL (ref 1.7–7.7)
PLATELETS: 220 10*3/uL (ref 150–400)
RBC: 5.12 MIL/uL (ref 4.22–5.81)
RDW: 14.1 % (ref 11.5–15.5)
WBC: 6.6 10*3/uL (ref 4.0–10.5)

## 2014-07-01 LAB — COMPLETE METABOLIC PANEL WITH GFR
ALT: 25 U/L (ref 0–53)
AST: 23 U/L (ref 0–37)
Albumin: 4.4 g/dL (ref 3.5–5.2)
Alkaline Phosphatase: 116 U/L (ref 39–117)
BILIRUBIN TOTAL: 0.5 mg/dL (ref 0.2–1.2)
BUN: 14 mg/dL (ref 6–23)
CALCIUM: 9.1 mg/dL (ref 8.4–10.5)
CHLORIDE: 102 meq/L (ref 96–112)
CO2: 26 mEq/L (ref 19–32)
CREATININE: 0.86 mg/dL (ref 0.50–1.35)
GFR, Est African American: >89 mL/min
GFR, Est Non African American: >89 mL/min
Glucose, Bld: 101 mg/dL — ABNORMAL HIGH (ref 70–99)
Potassium: 4.1 mEq/L (ref 3.5–5.3)
Sodium: 139 mEq/L (ref 135–145)
Total Protein: 7.3 g/dL (ref 6.0–8.3)

## 2014-07-01 LAB — MICROALBUMIN / CREATININE URINE RATIO
Creatinine, Urine: 17.7 mg/dL
Microalb, Ur: <0.2 mg/dL (ref <2.0)

## 2014-07-01 LAB — RPR: RPR Ser Ql: REACTIVE — AB

## 2014-07-01 LAB — RPR TITER

## 2014-07-02 LAB — URINE CYTOLOGY ANCILLARY ONLY
Chlamydia: NEGATIVE
Neisseria Gonorrhea: NEGATIVE

## 2014-07-02 LAB — T-HELPER CELL (CD4) - (RCID CLINIC ONLY)
CD4 T CELL ABS: 800 /uL (ref 400–2700)
CD4 T CELL HELPER: 39 % (ref 33–55)

## 2014-07-02 LAB — HIV-1 RNA QUANT-NO REFLEX-BLD

## 2014-07-03 LAB — FLUORESCENT TREPONEMAL AB(FTA)-IGG-BLD: FLUORESCENT TREPONEMAL ABS: REACTIVE — AB

## 2014-07-22 ENCOUNTER — Ambulatory Visit (INDEPENDENT_AMBULATORY_CARE_PROVIDER_SITE_OTHER): Payer: BC Managed Care – PPO | Admitting: Infectious Disease

## 2014-07-22 ENCOUNTER — Encounter: Payer: Self-pay | Admitting: Infectious Disease

## 2014-07-22 ENCOUNTER — Ambulatory Visit (INDEPENDENT_AMBULATORY_CARE_PROVIDER_SITE_OTHER): Payer: BC Managed Care – PPO | Admitting: *Deleted

## 2014-07-22 VITALS — BP 137/88 | HR 65 | Temp 97.7°F | Wt 183.0 lb

## 2014-07-22 DIAGNOSIS — B20 Human immunodeficiency virus [HIV] disease: Secondary | ICD-10-CM

## 2014-07-22 DIAGNOSIS — I10 Essential (primary) hypertension: Secondary | ICD-10-CM

## 2014-07-22 DIAGNOSIS — A530 Latent syphilis, unspecified as early or late: Secondary | ICD-10-CM

## 2014-07-22 DIAGNOSIS — Z23 Encounter for immunization: Secondary | ICD-10-CM

## 2014-07-22 NOTE — Progress Notes (Signed)
  Subjective:    Patient ID: Ryan Rich, male    DOB: 10/06/1965, 48 y.o.   MRN: [id number]  HPI   Ryan Rich is a 48 y.o. male who is doing superbly well on his  antiviral regimen, atripla with undetectable viral load and health cd4 count.   Lab Results  Component Value Date   HIV1RNAQUANT <20 07/01/2014   Lab Results  Component Value Date   CD4TABS 800 07/01/2014   CD4TABS 790 12/03/2013   CD4TABS 760 05/21/2013      I discussed other STR at last visit AND THis visit but he would like to remain on the current regimen.   Reviewed his CV risk factors and his BP 150 being up increased his risk we are rechecking BP  Which was 140s but he is under stress going to dentist for crown with high copay.  Emphasized need for smoking cessation. Also offered referral to our REPRIEVE study again but he is not interested.  Review of Systems  Constitutional: Negative for fever, chills, diaphoresis, activity change, appetite change, fatigue and unexpected weight change.  HENT: Negative for congestion, rhinorrhea, sinus pressure, sneezing, sore throat and trouble swallowing.   Eyes: Negative for photophobia and visual disturbance.  Respiratory: Negative for cough, chest tightness, shortness of breath, wheezing and stridor.   Cardiovascular: Negative for chest pain, palpitations and leg swelling.  Gastrointestinal: Negative for nausea, vomiting, abdominal pain, diarrhea, constipation, blood in stool, abdominal distention and anal bleeding.  Genitourinary: Negative for dysuria, hematuria, flank pain and difficulty urinating.  Musculoskeletal: Negative for myalgias, back pain, joint swelling, arthralgias and gait problem.  Skin: Negative for color change, pallor, rash and wound.  Neurological: Negative for dizziness, tremors, weakness and light-headedness.  Hematological: Negative for adenopathy. Does not bruise/bleed easily.  Psychiatric/Behavioral: Negative for behavioral problems,  confusion, sleep disturbance, dysphoric mood, decreased concentration and agitation.       Objective:   Physical Exam  Constitutional: He is oriented to person, place, and time. He appears well-developed and well-nourished. No distress.  HENT:  Head: Normocephalic and atraumatic.  Mouth/Throat: Oropharynx is clear and moist. No oropharyngeal exudate.  Eyes: Conjunctivae and EOM are normal. No scleral icterus.  Neck: Normal range of motion. Neck supple. No JVD present.  Cardiovascular: Normal rate and regular rhythm.   Pulmonary/Chest: Effort normal. No respiratory distress. He has no wheezes.  Abdominal: He exhibits no distension.  Musculoskeletal: He exhibits no edema or tenderness.  Lymphadenopathy:    He has no cervical adenopathy.  Neurological: He is alert and oriented to person, place, and time. He exhibits normal muscle tone. Coordination normal.  Skin: Skin is warm and dry. He is not diaphoretic. No erythema. No pallor.  Psychiatric: He has a normal mood and affect. His behavior is normal. Judgment and thought content normal.          Assessment & Plan:  HIV: continue Atripla, rtc in 9 months. Change to TAF based regimen at that time  I spent greater than 25 minutes with the patient including greater than 50% of time in face to face counsel of the patient and in coordination of their care.   Smoking: spent > 3 minutes counselling him re smoking cessation  HTN: largely due to pain, white coat htn and stress

## 2015-03-24 ENCOUNTER — Other Ambulatory Visit: Payer: BLUE CROSS/BLUE SHIELD

## 2015-03-24 DIAGNOSIS — B20 Human immunodeficiency virus [HIV] disease: Secondary | ICD-10-CM

## 2015-03-24 LAB — HEPATITIS C ANTIBODY: HCV AB: NEGATIVE

## 2015-03-24 LAB — CBC WITH DIFFERENTIAL/PLATELET
Basophils Absolute: 0 10*3/uL (ref 0.0–0.1)
Basophils Relative: 0 % (ref 0–1)
EOS ABS: 0.2 10*3/uL (ref 0.0–0.7)
Eosinophils Relative: 4 % (ref 0–5)
HEMATOCRIT: 46.7 % (ref 39.0–52.0)
Hemoglobin: 16.3 g/dL (ref 13.0–17.0)
LYMPHS PCT: 33 % (ref 12–46)
Lymphs Abs: 1.9 10*3/uL (ref 0.7–4.0)
MCH: 30.9 pg (ref 26.0–34.0)
MCHC: 34.9 g/dL (ref 30.0–36.0)
MCV: 88.6 fL (ref 78.0–100.0)
MPV: 10.1 fL (ref 8.6–12.4)
Monocytes Absolute: 0.5 10*3/uL (ref 0.1–1.0)
Monocytes Relative: 9 % (ref 3–12)
NEUTROS ABS: 3.1 10*3/uL (ref 1.7–7.7)
Neutrophils Relative %: 54 % (ref 43–77)
Platelets: 231 10*3/uL (ref 150–400)
RBC: 5.27 MIL/uL (ref 4.22–5.81)
RDW: 13.9 % (ref 11.5–15.5)
WBC: 5.8 10*3/uL (ref 4.0–10.5)

## 2015-03-24 LAB — COMPLETE METABOLIC PANEL WITH GFR
ALBUMIN: 4.5 g/dL (ref 3.6–5.1)
ALT: 34 U/L (ref 9–46)
AST: 29 U/L (ref 10–40)
Alkaline Phosphatase: 94 U/L (ref 40–115)
BILIRUBIN TOTAL: 0.5 mg/dL (ref 0.2–1.2)
BUN: 15 mg/dL (ref 7–25)
CALCIUM: 9.4 mg/dL (ref 8.6–10.3)
CO2: 27 mmol/L (ref 20–31)
Chloride: 104 mmol/L (ref 98–110)
Creat: 0.79 mg/dL (ref 0.60–1.35)
GFR, Est Non African American: >89 mL/min (ref >=60)
GLUCOSE: 97 mg/dL (ref 65–99)
POTASSIUM: 4.5 mmol/L (ref 3.5–5.3)
Sodium: 139 mmol/L (ref 135–146)
Total Protein: 7.3 g/dL (ref 6.1–8.1)

## 2015-03-24 LAB — RPR

## 2015-03-24 LAB — LIPID PANEL
CHOLESTEROL: 166 mg/dL (ref 125–200)
HDL: 33 mg/dL — AB (ref >=40)
LDL CALC: 115 mg/dL (ref <130)
TRIGLYCERIDES: 92 mg/dL (ref <150)
Total CHOL/HDL Ratio: 5 Ratio (ref <=5.0)
VLDL: 18 mg/dL (ref <30)

## 2015-03-24 NOTE — Addendum Note (Signed)
Addended by: Mariea Clonts D on: 03/24/2015 09:24 AM   Modules accepted: Orders

## 2015-03-25 LAB — T-HELPER CELL (CD4) - (RCID CLINIC ONLY)
CD4 % Helper T Cell: 40 % (ref 33–55)
CD4 T Cell Abs: 860 /uL (ref 400–2700)

## 2015-03-25 LAB — URINE CYTOLOGY ANCILLARY ONLY
Chlamydia: NEGATIVE
Neisseria Gonorrhea: NEGATIVE

## 2015-03-26 LAB — HIV-1 RNA QUANT-NO REFLEX-BLD
HIV 1 RNA Quant: 61 copies/mL — ABNORMAL HIGH (ref <20)
HIV-1 RNA Quant, Log: 1.79 {Log} — ABNORMAL HIGH (ref <1.30)

## 2015-04-01 ENCOUNTER — Other Ambulatory Visit: Payer: Self-pay | Admitting: *Deleted

## 2015-04-01 DIAGNOSIS — B20 Human immunodeficiency virus [HIV] disease: Secondary | ICD-10-CM

## 2015-04-01 MED ORDER — EFAVIRENZ-EMTRICITAB-TENOFOVIR 600-200-300 MG PO TABS: 1.0000 | ORAL_TABLET | Freq: Every day | ORAL | Status: DC

## 2015-04-07 ENCOUNTER — Ambulatory Visit (INDEPENDENT_AMBULATORY_CARE_PROVIDER_SITE_OTHER): Payer: BLUE CROSS/BLUE SHIELD | Admitting: Infectious Disease

## 2015-04-07 ENCOUNTER — Encounter: Payer: Self-pay | Admitting: Infectious Disease

## 2015-04-07 VITALS — BP 139/91 | HR 64 | Temp 98.0°F | Wt 183.0 lb

## 2015-04-07 DIAGNOSIS — B20 Human immunodeficiency virus [HIV] disease: Secondary | ICD-10-CM | POA: Diagnosis not present

## 2015-04-07 DIAGNOSIS — F339 Major depressive disorder, recurrent, unspecified: Secondary | ICD-10-CM | POA: Diagnosis not present

## 2015-04-07 DIAGNOSIS — I10 Essential (primary) hypertension: Secondary | ICD-10-CM | POA: Diagnosis not present

## 2015-04-07 MED ORDER — ELVITEG-COBIC-EMTRICIT-TENOFAF 150-150-200-10 MG PO TABS: 1.0000 | ORAL_TABLET | Freq: Every day | ORAL | Status: DC

## 2015-04-07 NOTE — Progress Notes (Signed)
  Subjective:    Patient ID: Ryan Rich, male    DOB: [birth year], 49 y.o.   MRN: [id number]  HPI   Ryan Rich is a 49 y.o. male who is doing superbly well on his  antiviral regimen, atripla with undetectable viral load and health cd4 count.   Lab Results  Component Value Date   HIV1RNAQUANT 61* 03/24/2015   Lab Results  Component Value Date   CD4TABS 860 03/24/2015   CD4TABS 800 07/01/2014   CD4TABS 790 12/03/2013      I discussed other STR at last visit AND THis visit he was now comfortable changing to GENVOYA     Review of Systems  Constitutional: Negative for fever, chills, diaphoresis, activity change, appetite change, fatigue and unexpected weight change.  HENT: Negative for congestion, rhinorrhea, sinus pressure, sneezing, sore throat and trouble swallowing.   Eyes: Negative for photophobia and visual disturbance.  Respiratory: Negative for cough, chest tightness, shortness of breath, wheezing and stridor.   Cardiovascular: Negative for chest pain, palpitations and leg swelling.  Gastrointestinal: Negative for nausea, vomiting, abdominal pain, diarrhea, constipation, blood in stool, abdominal distention and anal bleeding.  Genitourinary: Negative for dysuria, hematuria and difficulty urinating.  Musculoskeletal: Negative for myalgias, back pain, joint swelling, arthralgias, gait problem and neck pain.  Skin: Negative for color change, pallor, rash and wound.  Neurological: Negative for dizziness, tremors, weakness and light-headedness.  Hematological: Negative for adenopathy. Does not bruise/bleed easily.  Psychiatric/Behavioral: Negative for behavioral problems, confusion, sleep disturbance, dysphoric mood, decreased concentration and agitation.       Objective:   Physical Exam  Constitutional: He is oriented to person, place, and time. He appears well-developed and well-nourished. No distress.  HENT:  Head: Normocephalic and atraumatic.  Mouth/Throat:  Oropharynx is clear and moist. No oropharyngeal exudate.  Eyes: Conjunctivae and EOM are normal. No scleral icterus.  Neck: Normal range of motion. Neck supple. No JVD present.  Cardiovascular: Normal rate and regular rhythm.   Pulmonary/Chest: Effort normal. No respiratory distress. He has no wheezes.  Abdominal: He exhibits no distension.  Musculoskeletal: He exhibits no edema or tenderness.  Lymphadenopathy:    He has no cervical adenopathy.  Neurological: He is alert and oriented to person, place, and time. He exhibits normal muscle tone. Coordination normal.  Skin: Skin is warm and dry. He is not diaphoretic. No erythema. No pallor.  Psychiatric: He has a normal mood and affect. His behavior is normal. Judgment and thought content normal.          Assessment & Plan:  HIV: Change to GENVOYA and recheck labs in 1-2 months time   HTN:  Filed Vitals:   04/07/15 0836  BP: 139/91  Pulse: 64  Temp: 98 F (36.7 C)   Keep blood pressure log.  Depression: Not currently active  Syphilis: NON REAC (08/08 0903)   I spent greater than 40 minutes with the patient including greater than 50% of time in face to face counsel of the patient regarding HIV and his new anti-retroviral regimen that we are changing him to along with counseling with regards to depression and treatment of hypertension and prevention of psychiatry transmittal diseases and in coordination of their care.

## 2015-07-07 ENCOUNTER — Ambulatory Visit (INDEPENDENT_AMBULATORY_CARE_PROVIDER_SITE_OTHER): Payer: BLUE CROSS/BLUE SHIELD | Admitting: Infectious Disease

## 2015-07-07 VITALS — BP 144/91 | HR 75 | Temp 97.8°F | Wt 189.0 lb

## 2015-07-07 DIAGNOSIS — Z23 Encounter for immunization: Secondary | ICD-10-CM | POA: Diagnosis not present

## 2015-07-07 DIAGNOSIS — I10 Essential (primary) hypertension: Secondary | ICD-10-CM | POA: Diagnosis not present

## 2015-07-07 DIAGNOSIS — F339 Major depressive disorder, recurrent, unspecified: Secondary | ICD-10-CM | POA: Diagnosis not present

## 2015-07-07 DIAGNOSIS — B20 Human immunodeficiency virus [HIV] disease: Secondary | ICD-10-CM | POA: Diagnosis not present

## 2015-07-07 DIAGNOSIS — Z113 Encounter for screening for infections with a predominantly sexual mode of transmission: Secondary | ICD-10-CM

## 2015-07-07 DIAGNOSIS — Z79899 Other long term (current) drug therapy: Secondary | ICD-10-CM

## 2015-07-07 LAB — RPR

## 2015-07-07 LAB — CBC WITH DIFFERENTIAL/PLATELET
BASOS PCT: 0 % (ref 0–1)
Basophils Absolute: 0 10*3/uL (ref 0.0–0.1)
EOS ABS: 0.2 10*3/uL (ref 0.0–0.7)
EOS PCT: 3 % (ref 0–5)
HCT: 43.6 % (ref 39.0–52.0)
Hemoglobin: 15.4 g/dL (ref 13.0–17.0)
LYMPHS ABS: 2.3 10*3/uL (ref 0.7–4.0)
Lymphocytes Relative: 33 % (ref 12–46)
MCH: 31.7 pg (ref 26.0–34.0)
MCHC: 35.3 g/dL (ref 30.0–36.0)
MCV: 89.7 fL (ref 78.0–100.0)
MONO ABS: 0.8 10*3/uL (ref 0.1–1.0)
MONOS PCT: 11 % (ref 3–12)
MPV: 10.2 fL (ref 8.6–12.4)
NEUTROS PCT: 53 % (ref 43–77)
Neutro Abs: 3.7 10*3/uL (ref 1.7–7.7)
PLATELETS: 216 10*3/uL (ref 150–400)
RBC: 4.86 MIL/uL (ref 4.22–5.81)
RDW: 13.3 % (ref 11.5–15.5)
WBC: 6.9 10*3/uL (ref 4.0–10.5)

## 2015-07-07 LAB — COMPLETE METABOLIC PANEL WITH GFR
ALBUMIN: 4.1 g/dL (ref 3.6–5.1)
ALK PHOS: 98 U/L (ref 40–115)
ALT: 52 U/L — AB (ref 9–46)
AST: 33 U/L (ref 10–40)
BILIRUBIN TOTAL: 0.7 mg/dL (ref 0.2–1.2)
BUN: 14 mg/dL (ref 7–25)
CO2: 25 mmol/L (ref 20–31)
CREATININE: 0.8 mg/dL (ref 0.60–1.35)
Calcium: 9.3 mg/dL (ref 8.6–10.3)
Chloride: 105 mmol/L (ref 98–110)
GFR, Est African American: >89 mL/min (ref >=60)
GLUCOSE: 98 mg/dL (ref 65–99)
Potassium: 3.9 mmol/L (ref 3.5–5.3)
SODIUM: 138 mmol/L (ref 135–146)
TOTAL PROTEIN: 6.7 g/dL (ref 6.1–8.1)

## 2015-07-07 NOTE — Progress Notes (Signed)
Chief complaint: followup for HIV on meds Subjective:    Patient ID: Ryan Rich, male    DOB: [birth year], 49 y.o.   MRN: [id number]  HPI   Ryan Rich is a 49 y.o. male who is doing superbly well on his  antiviral regimen, [facility] but no new labs since change was made.    Lab Results  Component Value Date   HIV1RNAQUANT 61* 03/24/2015   Lab Results  Component Value Date   CD4TABS 860 03/24/2015   CD4TABS 800 07/01/2014   CD4TABS 790 12/03/2013    Past Medical History  Diagnosis Date  . HIV infection   . Tuberculosis   . Hypertension   . Depression   . Syphilis   . Anxiety     No past surgical history on file. No surgeries No family history on file.  Mom with HTN   Social History   Social History  . Marital Status: Single    Spouse Name: N/A  . Number of Children: N/A  . Years of Education: N/A   Social History Main Topics  . Smoking status: Current Every Day Smoker -- 10.00 packs/day    Types: Cigarettes  . Smokeless tobacco: Never Used  . Alcohol Use: No  . Drug Use: No  . Sexual Activity: Not on file   Other Topics Concern  . Not on file   Social History Narrative    No Known Allergies   Current outpatient prescriptions:  .  elvitegravir-cobicistat-emtricitabine-tenofovir (GENVOYA) 150-150-200-10 MG TABS tablet, Take 1 tablet by mouth daily with breakfast., Disp: 30 tablet, Rfl: 11    Review of Systems  Constitutional: Negative for fever, chills, diaphoresis, activity change, appetite change, fatigue and unexpected weight change.  HENT: Negative for congestion, rhinorrhea, sinus pressure, sneezing, sore throat and trouble swallowing.   Eyes: Negative for photophobia and visual disturbance.  Respiratory: Negative for cough, chest tightness, shortness of breath, wheezing and stridor.   Cardiovascular: Negative for chest pain, palpitations and leg swelling.  Gastrointestinal: Negative for nausea, vomiting,  abdominal pain, diarrhea, constipation, blood in stool, abdominal distention and anal bleeding.  Genitourinary: Negative for dysuria, hematuria and difficulty urinating.  Musculoskeletal: Negative for myalgias, back pain, joint swelling, arthralgias, gait problem and neck pain.  Skin: Negative for color change, pallor, rash and wound.  Neurological: Negative for dizziness, tremors, weakness and light-headedness.  Hematological: Negative for adenopathy. Does not bruise/bleed easily.  Psychiatric/Behavioral: Negative for behavioral problems, confusion, sleep disturbance, dysphoric mood, decreased concentration and agitation.       Objective:   Physical Exam  Constitutional: He is oriented to person, place, and time. He appears well-developed and well-nourished. No distress.  HENT:  Head: Normocephalic and atraumatic.  Mouth/Throat: Oropharynx is clear and moist. No oropharyngeal exudate.  Eyes: Conjunctivae and EOM are normal. No scleral icterus.  Neck: Normal range of motion. Neck supple. No JVD present.  Cardiovascular: Normal rate and regular rhythm.   Pulmonary/Chest: Effort normal. No respiratory distress. He has no wheezes.  Abdominal: He exhibits no distension.  Musculoskeletal: He exhibits no edema or tenderness.  Lymphadenopathy:    He has no cervical adenopathy.  Neurological: He is alert and oriented to person, place, and time. He exhibits normal muscle tone. Coordination normal.  Skin: Skin is warm and dry. He is not diaphoretic. No erythema. No pallor.  Psychiatric: He has a normal mood and affect. His behavior is normal. Judgment and thought content normal.  Assessment & Plan:  HIV: continue GENVOYA check labs today  HTN:  There were no vitals filed for this visit. Keep blood pressure log.  Depression: Not currently active  Syphilis: NON REAC (08/08 0903)   I spent greater than 40 minutes with the patient including greater than 50% of time in face to  face counsel of the patient regarding HIV and his new anti-retroviral regimen that we are changing him to along with counseling with regards to depression and treatment of hypertension and prevention of psychiatry transmittal diseases and in coordination of their care.

## 2015-07-07 NOTE — Patient Instructions (Signed)
We will get blood work today  Then RTC in one year with blood work prior to that visit  HAPPY THANKSGIVING!!

## 2015-07-08 LAB — T-HELPER CELLS (CD4) COUNT (NOT AT ARMC)
Absolute CD4: 875 cells/uL (ref 381–1469)
CD4 T HELPER %: 41 % (ref 32–62)
TOTAL LYMPHOCYTE COUNT: 2136 {cells}/uL (ref 700–3300)

## 2015-07-09 LAB — HIV RNA, RTPCR W/R GT (RTI, PI,INT): HIV 1 RNA Quant: <20 copies/mL (ref <20)

## 2016-03-24 ENCOUNTER — Other Ambulatory Visit: Payer: Self-pay | Admitting: *Deleted

## 2016-03-24 DIAGNOSIS — B20 Human immunodeficiency virus [HIV] disease: Secondary | ICD-10-CM

## 2016-03-24 MED ORDER — ELVITEG-COBIC-EMTRICIT-TENOFAF 150-150-200-10 MG PO TABS: 1.0000 | ORAL_TABLET | Freq: Every day | ORAL | 5 refills | Status: DC

## 2016-03-24 NOTE — Addendum Note (Signed)
Addended by: Jennet MaduroESTRIDGE, DENISE D on: 03/24/2016 08:18 AM   Modules accepted: Orders

## 2016-04-12 ENCOUNTER — Other Ambulatory Visit: Payer: Self-pay | Admitting: *Deleted

## 2016-04-12 DIAGNOSIS — B20 Human immunodeficiency virus [HIV] disease: Secondary | ICD-10-CM

## 2016-04-12 MED ORDER — ELVITEG-COBIC-EMTRICIT-TENOFAF 150-150-200-10 MG PO TABS: 1.0000 | ORAL_TABLET | Freq: Every day | ORAL | 1 refills | Status: DC

## 2016-04-12 NOTE — Telephone Encounter (Signed)
New specialty pharmacy - [facility],

## 2016-04-13 ENCOUNTER — Other Ambulatory Visit: Payer: Self-pay

## 2016-04-13 DIAGNOSIS — B20 Human immunodeficiency virus [HIV] disease: Secondary | ICD-10-CM

## 2016-04-13 MED ORDER — ELVITEG-COBIC-EMTRICIT-TENOFAF 150-150-200-10 MG PO TABS: 1.0000 | ORAL_TABLET | Freq: Every day | ORAL | 1 refills | Status: DC

## 2016-04-14 ENCOUNTER — Encounter: Payer: Self-pay | Admitting: Infectious Disease

## 2016-04-14 ENCOUNTER — Other Ambulatory Visit: Payer: Self-pay | Admitting: *Deleted

## 2016-04-14 DIAGNOSIS — B20 Human immunodeficiency virus [HIV] disease: Secondary | ICD-10-CM

## 2016-04-14 MED ORDER — ELVITEG-COBIC-EMTRICIT-TENOFAF 150-150-200-10 MG PO TABS: 1.0000 | ORAL_TABLET | Freq: Every day | ORAL | 5 refills | Status: DC

## 2016-05-17 ENCOUNTER — Other Ambulatory Visit: Payer: 59

## 2016-05-17 DIAGNOSIS — B20 Human immunodeficiency virus [HIV] disease: Secondary | ICD-10-CM

## 2016-05-17 DIAGNOSIS — Z79899 Other long term (current) drug therapy: Secondary | ICD-10-CM

## 2016-05-17 DIAGNOSIS — Z113 Encounter for screening for infections with a predominantly sexual mode of transmission: Secondary | ICD-10-CM

## 2016-05-17 LAB — CBC WITH DIFFERENTIAL/PLATELET
Basophils Absolute: 0 cells/uL (ref 0–200)
Basophils Relative: 0 %
EOS PCT: 2 %
Eosinophils Absolute: 150 cells/uL (ref 15–500)
HCT: 45.1 % (ref 38.5–50.0)
HEMOGLOBIN: 15.3 g/dL (ref 13.2–17.1)
LYMPHS ABS: 1800 {cells}/uL (ref 850–3900)
Lymphocytes Relative: 24 %
MCH: 30.5 pg (ref 27.0–33.0)
MCHC: 33.9 g/dL (ref 32.0–36.0)
MCV: 89.8 fL (ref 80.0–100.0)
MONOS PCT: 11 %
MPV: 9.9 fL (ref 7.5–12.5)
Monocytes Absolute: 825 cells/uL (ref 200–950)
NEUTROS ABS: 4725 {cells}/uL (ref 1500–7800)
NEUTROS PCT: 63 %
PLATELETS: 226 10*3/uL (ref 140–400)
RBC: 5.02 MIL/uL (ref 4.20–5.80)
RDW: 14 % (ref 11.0–15.0)
WBC: 7.5 10*3/uL (ref 3.8–10.8)

## 2016-05-17 LAB — COMPREHENSIVE METABOLIC PANEL
ALBUMIN: 4.3 g/dL (ref 3.6–5.1)
ALK PHOS: 93 U/L (ref 40–115)
ALT: 36 U/L (ref 9–46)
AST: 28 U/L (ref 10–40)
BUN: 14 mg/dL (ref 7–25)
CALCIUM: 9.1 mg/dL (ref 8.6–10.3)
CHLORIDE: 105 mmol/L (ref 98–110)
CO2: 26 mmol/L (ref 20–31)
Creat: 0.94 mg/dL (ref 0.60–1.35)
Glucose, Bld: 101 mg/dL — ABNORMAL HIGH (ref 65–99)
POTASSIUM: 3.9 mmol/L (ref 3.5–5.3)
Sodium: 139 mmol/L (ref 135–146)
TOTAL PROTEIN: 6.9 g/dL (ref 6.1–8.1)
Total Bilirubin: 0.7 mg/dL (ref 0.2–1.2)

## 2016-05-17 LAB — LIPID PANEL
CHOLESTEROL: 161 mg/dL (ref 125–200)
HDL: 35 mg/dL — AB (ref >=40)
LDL Cholesterol: 113 mg/dL (ref <130)
TRIGLYCERIDES: 67 mg/dL (ref <150)
Total CHOL/HDL Ratio: 4.6 Ratio (ref <=5.0)
VLDL: 13 mg/dL (ref <30)

## 2016-05-18 LAB — RPR TITER

## 2016-05-18 LAB — HIV-1 RNA QUANT-NO REFLEX-BLD

## 2016-05-18 LAB — T-HELPER CELL (CD4) - (RCID CLINIC ONLY)
CD4 % Helper T Cell: 36 % (ref 33–55)
CD4 T Cell Abs: 720 /uL (ref 400–2700)

## 2016-05-18 LAB — FLUORESCENT TREPONEMAL AB(FTA)-IGG-BLD: FLUORESCENT TREPONEMAL ABS: REACTIVE — AB

## 2016-05-18 LAB — RPR: RPR Ser Ql: REACTIVE — AB

## 2016-05-31 ENCOUNTER — Encounter: Payer: Self-pay | Admitting: Infectious Disease

## 2016-05-31 ENCOUNTER — Ambulatory Visit (INDEPENDENT_AMBULATORY_CARE_PROVIDER_SITE_OTHER): Payer: 59 | Admitting: Infectious Disease

## 2016-05-31 ENCOUNTER — Other Ambulatory Visit (HOSPITAL_COMMUNITY)
Admit: 2016-05-31 | Discharge: 2016-05-31 | Disposition: A | Discharge status: Home / Self Care | Payer: 59 | Attending: Infectious Disease | Admitting: Infectious Disease

## 2016-05-31 VITALS — BP 149/92 | HR 71 | Temp 97.8°F | Wt 193.0 lb

## 2016-05-31 DIAGNOSIS — I10 Essential (primary) hypertension: Secondary | ICD-10-CM

## 2016-05-31 DIAGNOSIS — B20 Human immunodeficiency virus [HIV] disease: Secondary | ICD-10-CM | POA: Diagnosis not present

## 2016-05-31 DIAGNOSIS — N200 Calculus of kidney: Secondary | ICD-10-CM | POA: Diagnosis not present

## 2016-05-31 DIAGNOSIS — Z23 Encounter for immunization: Secondary | ICD-10-CM

## 2016-05-31 DIAGNOSIS — F17200 Nicotine dependence, unspecified, uncomplicated: Secondary | ICD-10-CM

## 2016-05-31 DIAGNOSIS — A530 Latent syphilis, unspecified as early or late: Secondary | ICD-10-CM

## 2016-05-31 HISTORY — DX: Calculus of kidney: N20.0
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
## 2016-06-14 LAB — STONE ANALYSIS
CA OXALATE, DIHYDRATE: 25 %
Ca Oxalate,Monohydr.: 60 %
Ca phos cry stone ql IR: 15 %
Stone Weight KSTONE: 55 mg

## 2016-09-15 ENCOUNTER — Other Ambulatory Visit: Payer: Self-pay | Admitting: *Deleted

## 2016-09-15 DIAGNOSIS — B20 Human immunodeficiency virus [HIV] disease: Secondary | ICD-10-CM

## 2016-09-15 MED ORDER — ELVITEG-COBIC-EMTRICIT-TENOFAF 150-150-200-10 MG PO TABS: 1.0000 | ORAL_TABLET | Freq: Every day | ORAL | 5 refills | Status: DC

## 2016-09-27 ENCOUNTER — Encounter: Payer: Self-pay | Admitting: Infectious Disease

## 2017-03-29 ENCOUNTER — Other Ambulatory Visit: Payer: Self-pay | Admitting: Infectious Disease

## 2017-03-29 DIAGNOSIS — B20 Human immunodeficiency virus [HIV] disease: Secondary | ICD-10-CM

## 2017-05-23 ENCOUNTER — Other Ambulatory Visit (HOSPITAL_COMMUNITY)
Admission: RE | Admit: 2017-05-23 | Discharge: 2017-05-23 | Disposition: A | Discharge status: Home / Self Care | Payer: 59 | Source: Ambulatory Visit | Attending: Infectious Disease | Admitting: Infectious Disease

## 2017-05-23 ENCOUNTER — Other Ambulatory Visit: Payer: 59

## 2017-05-23 DIAGNOSIS — B20 Human immunodeficiency virus [HIV] disease: Secondary | ICD-10-CM | POA: Diagnosis not present

## 2017-05-24 LAB — T-HELPER CELL (CD4) - (RCID CLINIC ONLY)
CD4 % Helper T Cell: 39 % (ref 33–55)
CD4 T CELL ABS: 910 /uL (ref 400–2700)

## 2017-05-24 LAB — URINE CYTOLOGY ANCILLARY ONLY
CHLAMYDIA, DNA PROBE: NEGATIVE
NEISSERIA GONORRHEA: NEGATIVE

## 2017-05-25 LAB — COMPLETE METABOLIC PANEL WITH GFR
AG Ratio: 1.5 (calc) (ref 1.0–2.5)
ALBUMIN MSPROF: 4.4 g/dL (ref 3.6–5.1)
ALKALINE PHOSPHATASE (APISO): 89 U/L (ref 40–115)
ALT: 29 U/L (ref 9–46)
AST: 27 U/L (ref 10–40)
BUN: 16 mg/dL (ref 7–25)
CALCIUM: 9.1 mg/dL (ref 8.6–10.3)
CO2: 26 mmol/L (ref 20–32)
CREATININE: 0.97 mg/dL (ref 0.60–1.35)
Chloride: 103 mmol/L (ref 98–110)
GFR, EST NON AFRICAN AMERICAN: 91 mL/min/{1.73_m2} (ref >=60)
GFR, Est African American: 106 mL/min/{1.73_m2} (ref >=60)
GLOBULIN: 2.9 g/dL (ref 1.9–3.7)
Glucose, Bld: 109 mg/dL — ABNORMAL HIGH (ref 65–99)
Potassium: 4.1 mmol/L (ref 3.5–5.3)
SODIUM: 136 mmol/L (ref 135–146)
Total Bilirubin: 0.8 mg/dL (ref 0.2–1.2)
Total Protein: 7.3 g/dL (ref 6.1–8.1)

## 2017-05-25 LAB — CBC WITH DIFFERENTIAL/PLATELET
BASOS ABS: 20 {cells}/uL (ref 0–200)
Basophils Relative: 0.3 %
EOS ABS: 150 {cells}/uL (ref 15–500)
EOS PCT: 2.3 %
HEMATOCRIT: 44.3 % (ref 38.5–50.0)
HEMOGLOBIN: 15.3 g/dL (ref 13.2–17.1)
LYMPHS ABS: 2178 {cells}/uL (ref 850–3900)
MCH: 30.7 pg (ref 27.0–33.0)
MCHC: 34.5 g/dL (ref 32.0–36.0)
MCV: 89 fL (ref 80.0–100.0)
MPV: 10.4 fL (ref 7.5–12.5)
Monocytes Relative: 11.3 %
NEUTROS ABS: 3419 {cells}/uL (ref 1500–7800)
Neutrophils Relative %: 52.6 %
Platelets: 235 10*3/uL (ref 140–400)
RBC: 4.98 10*6/uL (ref 4.20–5.80)
RDW: 13 % (ref 11.0–15.0)
Total Lymphocyte: 33.5 %
WBC: 6.5 10*3/uL (ref 3.8–10.8)
WBCMIX: 735 {cells}/uL (ref 200–950)

## 2017-05-25 LAB — RPR TITER

## 2017-05-25 LAB — RPR: RPR Ser Ql: REACTIVE — AB

## 2017-05-25 LAB — HIV-1 RNA QUANT-NO REFLEX-BLD
HIV 1 RNA Quant: <20 copies/mL
HIV-1 RNA Quant, Log: <1.3 Log copies/mL

## 2017-05-25 LAB — FLUORESCENT TREPONEMAL AB(FTA)-IGG-BLD: FLUORESCENT TREPONEMAL ABS: REACTIVE — AB

## 2017-05-26 ENCOUNTER — Other Ambulatory Visit: Payer: Self-pay | Admitting: Infectious Disease

## 2017-05-26 DIAGNOSIS — B20 Human immunodeficiency virus [HIV] disease: Secondary | ICD-10-CM

## 2017-06-06 ENCOUNTER — Encounter: Payer: Self-pay | Admitting: Infectious Disease

## 2017-06-06 ENCOUNTER — Ambulatory Visit (INDEPENDENT_AMBULATORY_CARE_PROVIDER_SITE_OTHER): Payer: 59 | Admitting: Infectious Disease

## 2017-06-06 VITALS — BP 132/84 | HR 62 | Temp 97.8°F | Ht 70.0 in | Wt 195.0 lb

## 2017-06-06 DIAGNOSIS — B20 Human immunodeficiency virus [HIV] disease: Secondary | ICD-10-CM | POA: Diagnosis not present

## 2017-06-06 DIAGNOSIS — R739 Hyperglycemia, unspecified: Secondary | ICD-10-CM

## 2017-06-06 DIAGNOSIS — A530 Latent syphilis, unspecified as early or late: Secondary | ICD-10-CM | POA: Diagnosis not present

## 2017-06-06 DIAGNOSIS — Z23 Encounter for immunization: Secondary | ICD-10-CM | POA: Diagnosis not present

## 2017-06-06 DIAGNOSIS — F339 Major depressive disorder, recurrent, unspecified: Secondary | ICD-10-CM

## 2017-06-06 DIAGNOSIS — I10 Essential (primary) hypertension: Secondary | ICD-10-CM | POA: Diagnosis not present

## 2017-06-06 DIAGNOSIS — F17200 Nicotine dependence, unspecified, uncomplicated: Secondary | ICD-10-CM | POA: Diagnosis not present

## 2017-06-06 DIAGNOSIS — N200 Calculus of kidney: Secondary | ICD-10-CM

## 2017-06-06 HISTORY — DX: Hyperglycemia, unspecified: R73.9
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
## 2017-06-26 ENCOUNTER — Other Ambulatory Visit: Payer: Self-pay | Admitting: Infectious Disease

## 2017-06-26 DIAGNOSIS — B20 Human immunodeficiency virus [HIV] disease: Secondary | ICD-10-CM

## 2017-11-23 ENCOUNTER — Other Ambulatory Visit: Payer: Self-pay | Admitting: Infectious Disease

## 2017-11-23 DIAGNOSIS — B20 Human immunodeficiency virus [HIV] disease: Secondary | ICD-10-CM

## 2018-06-02 ENCOUNTER — Other Ambulatory Visit: Payer: Self-pay | Admitting: Infectious Disease

## 2018-06-02 DIAGNOSIS — B20 Human immunodeficiency virus [HIV] disease: Secondary | ICD-10-CM

## 2018-06-05 ENCOUNTER — Other Ambulatory Visit: Payer: Self-pay

## 2018-06-05 ENCOUNTER — Other Ambulatory Visit: Payer: 59

## 2018-06-05 ENCOUNTER — Telehealth: Payer: Self-pay

## 2018-06-05 ENCOUNTER — Other Ambulatory Visit (HOSPITAL_COMMUNITY)
Admission: RE | Admit: 2018-06-05 | Discharge: 2018-06-05 | Disposition: A | Discharge status: Home / Self Care | Payer: 59 | Source: Ambulatory Visit | Attending: Infectious Disease | Admitting: Infectious Disease

## 2018-06-05 DIAGNOSIS — B20 Human immunodeficiency virus [HIV] disease: Secondary | ICD-10-CM

## 2018-06-05 DIAGNOSIS — A530 Latent syphilis, unspecified as early or late: Secondary | ICD-10-CM | POA: Insufficient documentation

## 2018-06-05 MED ORDER — ELVITEG-COBIC-EMTRICIT-TENOFAF 150-150-200-10 MG PO TABS: 1.0000 | ORAL_TABLET | Freq: Every day | ORAL | 0 refills | Status: DC

## 2018-06-05 NOTE — Telephone Encounter (Signed)
Patient called today stating he does not have any refills on Genvoya. Is coming in on 06/19/18 to see Dr. Daiva Eves. Will give patient 30 day supply of Genvoya today, and will have additional refills sent into pharmacy after office visit. Lorenso Courier, [place]

## 2018-06-06 LAB — T-HELPER CELL (CD4) - (RCID CLINIC ONLY)
CD4 % Helper T Cell: 39 % (ref 33–55)
CD4 T CELL ABS: 810 /uL (ref 400–2700)

## 2018-06-06 LAB — MICROALBUMIN / CREATININE URINE RATIO
Creatinine, Urine: 38 mg/dL (ref 20–320)
MICROALB UR: 0.2 mg/dL
Microalb Creat Ratio: 5 mcg/mg creat (ref <30)

## 2018-06-06 LAB — URINE CYTOLOGY ANCILLARY ONLY
Chlamydia: NEGATIVE
NEISSERIA GONORRHEA: NEGATIVE

## 2018-06-06 NOTE — Telephone Encounter (Signed)
Patient called office stating that he was told by [facility] that they did not receive refills for medication. Last refill for Genvoya was submitted on 10/21. Called pharmacy to see if they did not receive any refills. Pharmacy stated that they did receive refill and that the pharmacy was running the claim before dispensing medication. Informed patient that pharmacy had the medication refill and that it can take 24 hours before medication is ready to dispense. Advised patient to call office a week before running out of meds to avoid delay. Patient verbalized understanding. Ryan Rich, [place]

## 2018-06-07 LAB — CBC WITH DIFFERENTIAL/PLATELET
Basophils Absolute: 33 cells/uL (ref 0–200)
Basophils Relative: 0.5 %
EOS PCT: 3.8 %
Eosinophils Absolute: 247 cells/uL (ref 15–500)
HEMATOCRIT: 48.9 % (ref 38.5–50.0)
HEMOGLOBIN: 16.6 g/dL (ref 13.2–17.1)
LYMPHS ABS: 1989 {cells}/uL (ref 850–3900)
MCH: 30.2 pg (ref 27.0–33.0)
MCHC: 33.9 g/dL (ref 32.0–36.0)
MCV: 89.1 fL (ref 80.0–100.0)
MPV: 10.4 fL (ref 7.5–12.5)
Monocytes Relative: 9.4 %
Neutro Abs: 3621 cells/uL (ref 1500–7800)
Neutrophils Relative %: 55.7 %
Platelets: 222 10*3/uL (ref 140–400)
RBC: 5.49 10*6/uL (ref 4.20–5.80)
RDW: 12.8 % (ref 11.0–15.0)
Total Lymphocyte: 30.6 %
WBC mixed population: 611 cells/uL (ref 200–950)
WBC: 6.5 10*3/uL (ref 3.8–10.8)

## 2018-06-07 LAB — COMPLETE METABOLIC PANEL WITH GFR
AG Ratio: 1.5 (calc) (ref 1.0–2.5)
ALKALINE PHOSPHATASE (APISO): 91 U/L (ref 40–115)
ALT: 21 U/L (ref 9–46)
AST: 21 U/L (ref 10–35)
Albumin: 4.5 g/dL (ref 3.6–5.1)
BUN: 14 mg/dL (ref 7–25)
CO2: 26 mmol/L (ref 20–32)
CREATININE: 1.02 mg/dL (ref 0.70–1.33)
Calcium: 9.5 mg/dL (ref 8.6–10.3)
Chloride: 104 mmol/L (ref 98–110)
GFR, Est African American: 99 mL/min/{1.73_m2} (ref >=60)
GFR, Est Non African American: 85 mL/min/{1.73_m2} (ref >=60)
GLUCOSE: 109 mg/dL — AB (ref 65–99)
Globulin: 3 g/dL (calc) (ref 1.9–3.7)
Potassium: 4.3 mmol/L (ref 3.5–5.3)
Sodium: 138 mmol/L (ref 135–146)
Total Bilirubin: 0.6 mg/dL (ref 0.2–1.2)
Total Protein: 7.5 g/dL (ref 6.1–8.1)

## 2018-06-07 LAB — HEMOGLOBIN A1C
HEMOGLOBIN A1C: 5.2 %{Hb} (ref <5.7)
Mean Plasma Glucose: 103 (calc)
eAG (mmol/L): 5.7 (calc)

## 2018-06-07 LAB — FLUORESCENT TREPONEMAL AB(FTA)-IGG-BLD: FLUORESCENT TREPONEMAL ABS: REACTIVE — AB

## 2018-06-07 LAB — LIPID PANEL
CHOL/HDL RATIO: 5.9 (calc) — AB (ref <5.0)
Cholesterol: 234 mg/dL — ABNORMAL HIGH (ref <200)
HDL: 40 mg/dL — ABNORMAL LOW (ref >40)
LDL CHOLESTEROL (CALC): 160 mg/dL — AB
NON-HDL CHOLESTEROL (CALC): 194 mg/dL — AB (ref <130)
TRIGLYCERIDES: 182 mg/dL — AB (ref <150)

## 2018-06-07 LAB — RPR TITER

## 2018-06-07 LAB — HIV-1 RNA QUANT-NO REFLEX-BLD
HIV 1 RNA QUANT: NOT DETECTED {copies}/mL
HIV-1 RNA QUANT, LOG: NOT DETECTED {Log_copies}/mL

## 2018-06-07 LAB — RPR: RPR: REACTIVE — AB

## 2018-06-19 ENCOUNTER — Ambulatory Visit (INDEPENDENT_AMBULATORY_CARE_PROVIDER_SITE_OTHER): Payer: 59 | Admitting: Infectious Diseases

## 2018-06-19 ENCOUNTER — Encounter: Payer: Self-pay | Admitting: Infectious Diseases

## 2018-06-19 VITALS — BP 138/84 | HR 56 | Temp 97.7°F | Ht 70.0 in | Wt 190.0 lb

## 2018-06-19 DIAGNOSIS — I10 Essential (primary) hypertension: Secondary | ICD-10-CM

## 2018-06-19 DIAGNOSIS — Z0000 Encounter for general adult medical examination without abnormal findings: Secondary | ICD-10-CM

## 2018-06-19 DIAGNOSIS — B20 Human immunodeficiency virus [HIV] disease: Secondary | ICD-10-CM

## 2018-06-19 DIAGNOSIS — E782 Mixed hyperlipidemia: Secondary | ICD-10-CM | POA: Insufficient documentation

## 2018-06-19 DIAGNOSIS — Z23 Encounter for immunization: Secondary | ICD-10-CM | POA: Diagnosis not present

## 2018-06-19 MED ORDER — ELVITEG-COBIC-EMTRICIT-TENOFAF 150-150-200-10 MG PO TABS: 1.0000 | ORAL_TABLET | Freq: Every day | ORAL | 11 refills | Status: DC

## 2018-06-19 NOTE — Assessment & Plan Note (Signed)
He has done excellent for many years on regimen for his HIV. Remains undetectable today. Will provide refills and he can come back in 12 months to see Dr. Daiva Eves again. Condoms provided. Labs reviewed in detail with Tim.  Will give Prevnar and Flu vaccine today.
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
## 2018-06-19 NOTE — Patient Instructions (Signed)
Nice to meet you today.   I have refilled your Genvoya for 12 months.   Would try to reduce your cigarettes to half what you currently smoke over the next 3 months as a goal.   Will start the process for colonoscopy referral for you in [place].   Please start looking into a primary care team (internal medicine or Family medicine) to help with monitoring your cholesterol a little closer to where you live.   Things to start with : 1. Try to reduce the amount of snacks you eat a day (any lick, sip or bite counts--try not to be a "grazer")  2. Reduce and eliminate all sodas and other sugary drinks  3. Try to not eat out of boxes at the grocery store.   Please come back in 1 year to see Dr. Daiva Eves   I would like to rechedk your cholesterol again in 3-4 months to see how diet changes are helping. Please fast (no food) for 8 hours before this blood work. Can be done with PCP if you get one closer to you.

## 2018-06-19 NOTE — Assessment & Plan Note (Signed)
Will place referral for GI/colonoscopy in [place]. He will look for PCP locally so they can help with monitoring his cholesterol levels. Discussed recommendations about prostate cancer screening today - he is interested in PSA blood work.

## 2018-06-19 NOTE — Assessment & Plan Note (Signed)
BP Readings from Last 3 Encounters:  06/19/18 138/84  06/06/17 132/84  05/31/16 (!) 149/92   Under good control on current regimen. Encouraged to reduce and eventually eliminate smoking.

## 2018-06-19 NOTE — Assessment & Plan Note (Signed)
Reviewed labs and current recommendations for lipid monitoring. He would like to avoid adding a statin if he can. Discussed different approaches at diet modification and recommended working with a PCP to see which would be best for him. Discussed how PLWH are at higher risk overall for CV event and recommend modifying risk factors (no smoking and cleaner diet) he can control. He will either repeat a lipid level in 4 months here or with a new PCP locally.

## 2018-06-19 NOTE — Progress Notes (Signed)
Flu and Prevnar-13 administered today. Patient tolerated well.

## 2018-09-01 ENCOUNTER — Other Ambulatory Visit: Payer: Self-pay | Admitting: *Deleted

## 2018-09-01 DIAGNOSIS — B20 Human immunodeficiency virus [HIV] disease: Secondary | ICD-10-CM

## 2018-09-01 MED ORDER — ELVITEG-COBIC-EMTRICIT-TENOFAF 150-150-200-10 MG PO TABS: 1.0000 | ORAL_TABLET | Freq: Every day | ORAL | 5 refills | Status: DC

## 2018-09-25 ENCOUNTER — Encounter: Payer: Self-pay | Admitting: Infectious Diseases

## 2018-09-25 ENCOUNTER — Other Ambulatory Visit (HOSPITAL_COMMUNITY)
Admission: RE | Admit: 2018-09-25 | Discharge: 2018-09-25 | Disposition: A | Discharge status: Home / Self Care | Payer: 59 | Source: Ambulatory Visit | Attending: Infectious Diseases | Admitting: Infectious Diseases

## 2018-09-25 ENCOUNTER — Ambulatory Visit: Payer: 59 | Admitting: Infectious Diseases

## 2018-09-25 VITALS — BP 127/82 | HR 62 | Temp 97.9°F | Wt 187.0 lb

## 2018-09-25 DIAGNOSIS — Z113 Encounter for screening for infections with a predominantly sexual mode of transmission: Secondary | ICD-10-CM | POA: Insufficient documentation

## 2018-09-25 DIAGNOSIS — L739 Follicular disorder, unspecified: Secondary | ICD-10-CM | POA: Insufficient documentation

## 2018-09-25 DIAGNOSIS — B20 Human immunodeficiency virus [HIV] disease: Secondary | ICD-10-CM | POA: Diagnosis not present

## 2018-09-25 MED ORDER — MUPIROCIN CALCIUM 2 % EX CREA: 1.0000 "application " | TOPICAL_CREAM | Freq: Two times a day (BID) | CUTANEOUS | 0 refills | Status: DC

## 2018-09-25 NOTE — Patient Instructions (Addendum)
Your rash appears consistent with folliculitis (see info below).  Keep the area clean with soap and warm water, pat dry. Try not to rub with a towel - this will irritate area more.   Use Mupirocin cream twice a day to the rash. Can use Hydrocortisone for itching. Would keep it covered if you find that you are itching it frequently.   Will call you with results of your labs if you require treatment - otherwise will release them to your MyChart.   Folliculitis  Folliculitis is inflammation of the hair follicles. Folliculitis most commonly occurs on the scalp, thighs, legs, back, and buttocks. However, it can occur anywhere on the body. What are the causes? This condition may be caused by:  A bacterial infection (common).  A fungal infection.  A viral infection.  Coming into contact with certain chemicals, especially oils and tars.  Shaving or waxing.  Applying greasy ointments or creams to your skin often. Long-lasting folliculitis and folliculitis that keeps coming back can be caused by bacteria that live in the nostrils. What increases the risk? This condition is more likely to develop in people with:  A weakened immune system.  Diabetes.  Obesity. What are the signs or symptoms? Symptoms of this condition include:  Redness.  Soreness.  Swelling.  Itching.  Small white or yellow, pus-filled, itchy spots (pustules) that appear over a reddened area. If there is an infection that goes deep into the follicle, these may develop into a boil (furuncle).  A group of closely packed boils (carbuncle). These tend to form in hairy, sweaty areas of the body. How is this diagnosed? This condition is diagnosed with a skin exam. To find what is causing the condition, your health care provider may take a sample of one of the pustules or boils for testing. How is this treated? This condition may be treated by:  Applying warm compresses to the affected areas.  Taking an antibiotic  medicine or applying an antibiotic medicine to the skin.  Applying or bathing with an antiseptic solution.  Taking an over-the-counter medicine to help with itching.  Having a procedure to drain any pustules or boils. This may be done if a pustule or boil contains a lot of pus or fluid.  Laser hair removal. This may be done to treat long-lasting folliculitis. Follow these instructions at home:  If directed, apply heat to the affected area as often as told by your health care provider. Use the heat source that your health care provider recommends, such as a moist heat pack or a heating pad. ? Place a towel between your skin and the heat source. ? Leave the heat on for 20-30 minutes. ? Remove the heat if your skin turns bright red. This is especially important if you are unable to feel pain, heat, or cold. You may have a greater risk of getting burned.  If you were prescribed an antibiotic medicine, use it as told by your health care provider. Do not stop using the antibiotic even if you start to feel better.  Take over-the-counter and prescription medicines only as told by your health care provider.  Do not shave irritated skin.  Keep all follow-up visits as told by your health care provider. This is important. Get help right away if:  You have more redness, swelling, or pain in the affected area.  Red streaks are spreading from the affected area.  You have a fever. This information is not intended to replace advice given to  you by your health care provider. Make sure you discuss any questions you have with your health care provider. Document Released: 10/11/2001 Document Revised: 02/20/2016 Document Reviewed: 05/23/2015 Elsevier Interactive Patient Education  2019 [employer].
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
## 2018-09-25 NOTE — Assessment & Plan Note (Signed)
Unilateral rash with punctuated crusts/scabs and erythematous base that is described to be only mildly pruritic. This is not consistent with syphilis.  It sounds as if he gets pustular lesions scattered on thighs and with h/o what sounds like a small furuncle on scrotum likely bacterial in nature. Will treat as folliculitis - advised soap and water to clean, pat dry. Mupirocin cream BID for a week to see if this helps clear up with PRN hydrocortisone as needed for itching.  Briefly considered zoster considering appearance however history is not consistent with this.  Return if not-improved.

## 2018-09-25 NOTE — Assessment & Plan Note (Signed)
Doing well - not due for labs today. Has private insurance still. Follow up with Dr. Daiva Eves as scheduled in November.

## 2018-09-25 NOTE — Assessment & Plan Note (Signed)
Screen serum RPR, 3-point swabs for gonorrhea/chlamydia. No active genitourinary symptoms or rectal symptoms. Offered presumptive treatment today however he is not interested in medications unless needed. He will abstain from further sexual encounters until his labs result.

## 2018-09-25 NOTE — Assessment & Plan Note (Signed)
Screen today with serum RPR.

## 2018-09-26 LAB — URINE CYTOLOGY ANCILLARY ONLY
Chlamydia: NEGATIVE
Neisseria Gonorrhea: NEGATIVE

## 2018-09-26 LAB — CYTOLOGY, (ORAL, ANAL, URETHRAL) ANCILLARY ONLY
CHLAMYDIA, DNA PROBE: NEGATIVE
Chlamydia: NEGATIVE
NEISSERIA GONORRHEA: NEGATIVE
Neisseria Gonorrhea: NEGATIVE

## 2018-09-27 LAB — RPR TITER: RPR Titer: 1:1 {titer} — ABNORMAL HIGH

## 2018-09-27 LAB — FLUORESCENT TREPONEMAL AB(FTA)-IGG-BLD: Fluorescent Treponemal ABS: REACTIVE — AB

## 2018-09-27 LAB — RPR: RPR Ser Ql: REACTIVE — AB

## 2019-03-05 ENCOUNTER — Other Ambulatory Visit: Payer: Self-pay

## 2019-03-05 DIAGNOSIS — B20 Human immunodeficiency virus [HIV] disease: Secondary | ICD-10-CM

## 2019-03-05 MED ORDER — GENVOYA 150-150-200-10 MG PO TABS: 1.0000 | ORAL_TABLET | Freq: Every day | ORAL | 5 refills | Status: DC

## 2019-06-01 ENCOUNTER — Other Ambulatory Visit: Payer: Self-pay

## 2019-06-01 DIAGNOSIS — B20 Human immunodeficiency virus [HIV] disease: Secondary | ICD-10-CM

## 2019-06-01 DIAGNOSIS — Z113 Encounter for screening for infections with a predominantly sexual mode of transmission: Secondary | ICD-10-CM

## 2019-06-01 DIAGNOSIS — Z79899 Other long term (current) drug therapy: Secondary | ICD-10-CM

## 2019-06-04 ENCOUNTER — Other Ambulatory Visit (HOSPITAL_COMMUNITY)
Admission: RE | Admit: 2019-06-04 | Discharge: 2019-06-04 | Disposition: A | Discharge status: Home / Self Care | Payer: 59 | Source: Ambulatory Visit | Attending: Infectious Disease | Admitting: Infectious Disease

## 2019-06-04 ENCOUNTER — Other Ambulatory Visit: Payer: Self-pay

## 2019-06-04 ENCOUNTER — Other Ambulatory Visit: Payer: 59

## 2019-06-04 DIAGNOSIS — Z113 Encounter for screening for infections with a predominantly sexual mode of transmission: Secondary | ICD-10-CM | POA: Insufficient documentation

## 2019-06-04 DIAGNOSIS — B20 Human immunodeficiency virus [HIV] disease: Secondary | ICD-10-CM | POA: Diagnosis present

## 2019-06-04 DIAGNOSIS — Z79899 Other long term (current) drug therapy: Secondary | ICD-10-CM

## 2019-06-05 LAB — T-HELPER CELL (CD4) - (RCID CLINIC ONLY)
CD4 % Helper T Cell: 41 % (ref 33–65)
CD4 T Cell Abs: 780 /uL (ref 400–1790)

## 2019-06-07 LAB — CBC WITH DIFFERENTIAL/PLATELET
Absolute Monocytes: 573 cells/uL (ref 200–950)
Basophils Absolute: 31 cells/uL (ref 0–200)
Basophils Relative: 0.5 %
Eosinophils Absolute: 220 cells/uL (ref 15–500)
Eosinophils Relative: 3.6 %
HCT: 48.2 % (ref 38.5–50.0)
Hemoglobin: 16.6 g/dL (ref 13.2–17.1)
Lymphs Abs: 1934 cells/uL (ref 850–3900)
MCH: 30.5 pg (ref 27.0–33.0)
MCHC: 34.4 g/dL (ref 32.0–36.0)
MCV: 88.6 fL (ref 80.0–100.0)
MPV: 10.4 fL (ref 7.5–12.5)
Monocytes Relative: 9.4 %
Neutro Abs: 3343 cells/uL (ref 1500–7800)
Neutrophils Relative %: 54.8 %
Platelets: 246 10*3/uL (ref 140–400)
RBC: 5.44 10*6/uL (ref 4.20–5.80)
RDW: 13.4 % (ref 11.0–15.0)
Total Lymphocyte: 31.7 %
WBC: 6.1 10*3/uL (ref 3.8–10.8)

## 2019-06-07 LAB — RPR: RPR Ser Ql: REACTIVE — AB

## 2019-06-07 LAB — LIPID PANEL
Cholesterol: 201 mg/dL — ABNORMAL HIGH (ref <200)
HDL: 38 mg/dL — ABNORMAL LOW (ref >=40)
LDL Cholesterol (Calc): 136 mg/dL (calc) — ABNORMAL HIGH
Non-HDL Cholesterol (Calc): 163 mg/dL (calc) — ABNORMAL HIGH (ref <130)
Total CHOL/HDL Ratio: 5.3 (calc) — ABNORMAL HIGH (ref <5.0)
Triglycerides: 143 mg/dL (ref <150)

## 2019-06-07 LAB — COMPREHENSIVE METABOLIC PANEL
AG Ratio: 1.6 (calc) (ref 1.0–2.5)
ALT: 39 U/L (ref 9–46)
AST: 32 U/L (ref 10–35)
Albumin: 4.4 g/dL (ref 3.6–5.1)
Alkaline phosphatase (APISO): 87 U/L (ref 35–144)
BUN: 14 mg/dL (ref 7–25)
CO2: 25 mmol/L (ref 20–32)
Calcium: 9.4 mg/dL (ref 8.6–10.3)
Chloride: 106 mmol/L (ref 98–110)
Creat: 0.88 mg/dL (ref 0.70–1.33)
Globulin: 2.8 g/dL (calc) (ref 1.9–3.7)
Glucose, Bld: 112 mg/dL — ABNORMAL HIGH (ref 65–99)
Potassium: 4.4 mmol/L (ref 3.5–5.3)
Sodium: 138 mmol/L (ref 135–146)
Total Bilirubin: 0.6 mg/dL (ref 0.2–1.2)
Total Protein: 7.2 g/dL (ref 6.1–8.1)

## 2019-06-07 LAB — URINE CYTOLOGY ANCILLARY ONLY
Chlamydia: NEGATIVE
Comment: NEGATIVE
Comment: NORMAL
Neisseria Gonorrhea: NEGATIVE

## 2019-06-07 LAB — HIV-1 RNA QUANT-NO REFLEX-BLD
HIV 1 RNA Quant: <20 copies/mL
HIV-1 RNA Quant, Log: <1.3 Log copies/mL

## 2019-06-07 LAB — RPR TITER: RPR Titer: 1:2 {titer} — ABNORMAL HIGH

## 2019-06-07 LAB — FLUORESCENT TREPONEMAL AB(FTA)-IGG-BLD: Fluorescent Treponemal ABS: REACTIVE — AB

## 2019-06-18 ENCOUNTER — Other Ambulatory Visit: Payer: Self-pay

## 2019-06-18 ENCOUNTER — Ambulatory Visit: Payer: 59 | Admitting: Infectious Disease

## 2019-06-18 VITALS — BP 168/102 | HR 67 | Temp 97.3°F | Wt 199.0 lb

## 2019-06-18 DIAGNOSIS — B20 Human immunodeficiency virus [HIV] disease: Secondary | ICD-10-CM | POA: Diagnosis not present

## 2019-06-18 DIAGNOSIS — I10 Essential (primary) hypertension: Secondary | ICD-10-CM | POA: Diagnosis not present

## 2019-06-18 DIAGNOSIS — Z23 Encounter for immunization: Secondary | ICD-10-CM

## 2019-06-18 DIAGNOSIS — Z113 Encounter for screening for infections with a predominantly sexual mode of transmission: Secondary | ICD-10-CM

## 2019-06-18 DIAGNOSIS — F17200 Nicotine dependence, unspecified, uncomplicated: Secondary | ICD-10-CM

## 2019-06-18 DIAGNOSIS — Z8619 Personal history of other infectious and parasitic diseases: Secondary | ICD-10-CM
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
## 2019-08-17 ENCOUNTER — Other Ambulatory Visit: Payer: Self-pay | Admitting: Infectious Diseases

## 2019-08-17 DIAGNOSIS — B20 Human immunodeficiency virus [HIV] disease: Secondary | ICD-10-CM

## 2020-05-26 ENCOUNTER — Other Ambulatory Visit: Payer: Self-pay

## 2020-05-26 ENCOUNTER — Other Ambulatory Visit: Payer: 59

## 2020-05-26 DIAGNOSIS — B20 Human immunodeficiency virus [HIV] disease: Secondary | ICD-10-CM

## 2020-05-26 DIAGNOSIS — I10 Essential (primary) hypertension: Secondary | ICD-10-CM

## 2020-05-26 DIAGNOSIS — F17200 Nicotine dependence, unspecified, uncomplicated: Secondary | ICD-10-CM

## 2020-05-26 DIAGNOSIS — Z113 Encounter for screening for infections with a predominantly sexual mode of transmission: Secondary | ICD-10-CM

## 2020-05-27 LAB — T-HELPER CELL (CD4) - (RCID CLINIC ONLY)
CD4 % Helper T Cell: 41 % (ref 33–65)
CD4 T Cell Abs: 784 /uL (ref 400–1790)

## 2020-05-28 ENCOUNTER — Telehealth: Payer: Self-pay | Admitting: *Deleted

## 2020-05-28 LAB — CBC WITH DIFFERENTIAL/PLATELET
Absolute Monocytes: 550 cells/uL (ref 200–950)
Basophils Absolute: 38 cells/uL (ref 0–200)
Basophils Relative: 0.6 %
Eosinophils Absolute: 198 cells/uL (ref 15–500)
Eosinophils Relative: 3.1 %
HCT: 47.8 % (ref 38.5–50.0)
Hemoglobin: 16.2 g/dL (ref 13.2–17.1)
Lymphs Abs: 1894 cells/uL (ref 850–3900)
MCH: 30.2 pg (ref 27.0–33.0)
MCHC: 33.9 g/dL (ref 32.0–36.0)
MCV: 89 fL (ref 80.0–100.0)
MPV: 10.1 fL (ref 7.5–12.5)
Monocytes Relative: 8.6 %
Neutro Abs: 3718 cells/uL (ref 1500–7800)
Neutrophils Relative %: 58.1 %
Platelets: 274 10*3/uL (ref 140–400)
RBC: 5.37 10*6/uL (ref 4.20–5.80)
RDW: 12.8 % (ref 11.0–15.0)
Total Lymphocyte: 29.6 %
WBC: 6.4 10*3/uL (ref 3.8–10.8)

## 2020-05-28 LAB — COMPLETE METABOLIC PANEL WITH GFR
AG Ratio: 1.8 (calc) (ref 1.0–2.5)
ALT: 19 U/L (ref 9–46)
AST: 23 U/L (ref 10–35)
Albumin: 4.4 g/dL (ref 3.6–5.1)
Alkaline phosphatase (APISO): 90 U/L (ref 35–144)
BUN: 13 mg/dL (ref 7–25)
CO2: 28 mmol/L (ref 20–32)
Calcium: 9.3 mg/dL (ref 8.6–10.3)
Chloride: 103 mmol/L (ref 98–110)
Creat: 0.97 mg/dL (ref 0.70–1.33)
GFR, Est African American: 104 mL/min/{1.73_m2} (ref >=60)
GFR, Est Non African American: 89 mL/min/{1.73_m2} (ref >=60)
Globulin: 2.4 g/dL (calc) (ref 1.9–3.7)
Glucose, Bld: 107 mg/dL — ABNORMAL HIGH (ref 65–99)
Potassium: 4.3 mmol/L (ref 3.5–5.3)
Sodium: 137 mmol/L (ref 135–146)
Total Bilirubin: 0.7 mg/dL (ref 0.2–1.2)
Total Protein: 6.8 g/dL (ref 6.1–8.1)

## 2020-05-28 LAB — FLUORESCENT TREPONEMAL AB(FTA)-IGG-BLD: Fluorescent Treponemal ABS: REACTIVE — AB

## 2020-05-28 LAB — LIPID PANEL
Cholesterol: 162 mg/dL (ref <200)
HDL: 40 mg/dL (ref >=40)
LDL Cholesterol (Calc): 106 mg/dL (calc) — ABNORMAL HIGH
Non-HDL Cholesterol (Calc): 122 mg/dL (calc) (ref <130)
Total CHOL/HDL Ratio: 4.1 (calc) (ref <5.0)
Triglycerides: 72 mg/dL (ref <150)

## 2020-05-28 LAB — RPR: RPR Ser Ql: REACTIVE — AB

## 2020-05-28 LAB — HIV-1 RNA QUANT-NO REFLEX-BLD
HIV 1 RNA Quant: 40 Copies/mL — ABNORMAL HIGH
HIV-1 RNA Quant, Log: 1.61 Log cps/mL — ABNORMAL HIGH

## 2020-05-28 LAB — RPR TITER: RPR Titer: 1:1 {titer} — ABNORMAL HIGH

## 2020-05-28 NOTE — Telephone Encounter (Signed)
Patient sent message requesting appointment for STI treatment. RN called patient, left message asking him to call his doctor's office to speak with a nurse.  Patient is serofast, has follow up scheduled 10/25. Andree Coss, RN

## 2020-06-03 ENCOUNTER — Other Ambulatory Visit: Payer: 59

## 2020-06-06 ENCOUNTER — Other Ambulatory Visit: Payer: Self-pay | Admitting: Infectious Disease

## 2020-06-06 DIAGNOSIS — B20 Human immunodeficiency virus [HIV] disease: Secondary | ICD-10-CM

## 2020-06-09 ENCOUNTER — Encounter: Payer: Self-pay | Admitting: Infectious Disease

## 2020-06-09 ENCOUNTER — Other Ambulatory Visit: Payer: Self-pay

## 2020-06-09 ENCOUNTER — Ambulatory Visit: Payer: 59 | Admitting: Infectious Disease

## 2020-06-09 VITALS — BP 135/83 | HR 57 | Temp 97.8°F | Wt 189.0 lb

## 2020-06-09 DIAGNOSIS — Z23 Encounter for immunization: Secondary | ICD-10-CM

## 2020-06-09 DIAGNOSIS — Z8619 Personal history of other infectious and parasitic diseases: Secondary | ICD-10-CM

## 2020-06-09 DIAGNOSIS — I10 Essential (primary) hypertension: Secondary | ICD-10-CM | POA: Diagnosis not present

## 2020-06-09 DIAGNOSIS — Z113 Encounter for screening for infections with a predominantly sexual mode of transmission: Secondary | ICD-10-CM

## 2020-06-09 DIAGNOSIS — B20 Human immunodeficiency virus [HIV] disease: Secondary | ICD-10-CM

## 2020-06-09 MED ORDER — GENVOYA 150-150-200-10 MG PO TABS: 1.0000 | ORAL_TABLET | Freq: Every day | ORAL | 11 refills | Status: DC
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
## 2020-06-17 ENCOUNTER — Encounter: Payer: 59 | Admitting: Infectious Disease

## 2020-06-24 ENCOUNTER — Other Ambulatory Visit: Payer: Self-pay | Admitting: *Deleted

## 2020-06-24 DIAGNOSIS — B20 Human immunodeficiency virus [HIV] disease: Secondary | ICD-10-CM

## 2020-06-24 MED ORDER — GENVOYA 150-150-200-10 MG PO TABS: 1.0000 | ORAL_TABLET | Freq: Every day | ORAL | 11 refills | Status: DC

## 2020-09-29 ENCOUNTER — Other Ambulatory Visit: Payer: Self-pay | Admitting: Infectious Disease

## 2020-09-29 ENCOUNTER — Telehealth: Payer: Self-pay

## 2020-09-29 DIAGNOSIS — B20 Human immunodeficiency virus [HIV] disease: Secondary | ICD-10-CM

## 2020-09-29 MED ORDER — GENVOYA 150-150-200-10 MG PO TABS: 1.0000 | ORAL_TABLET | Freq: Every day | ORAL | 7 refills | Status: DC

## 2020-09-29 MED FILL — GENVOYA TABLET: 150-150-200 | 30 days supply | Qty: 30 | Fill #0

## 2020-09-29 NOTE — Telephone Encounter (Signed)
Patient still having trouble with [facility]. Per Lupita Leash, patient can switch to [facility], she will set up delivery for patient and patient will hopefully receive Genvoya by tomorrow. Patient agreeable to plan, RN made patient aware that Lupita Leash has obtained a copay card for him and that [facility] will call him every month to confirm delivery. Patient verbalized understanding and has no further questions. RN called [facility] to cancel patient's Genvoya prescription with them.   Sandie Ano, RN

## 2020-09-29 NOTE — Telephone Encounter (Signed)
[facility] Patient Advocate Encounter   Was successful in obtaining a [employer] copay card for Genvoya.  This copay card will make the patients copay $0.00.  I have spoken with the patient.    The billing information is as follows and has been shared with [facility].        Kasch Borquez, CPhT Specialty Pharmacy Patient [facility] Phone: [phone] Fax:  [phone]  

## 2020-11-20 ENCOUNTER — Other Ambulatory Visit (HOSPITAL_COMMUNITY): Payer: Self-pay

## 2020-11-25 ENCOUNTER — Other Ambulatory Visit (HOSPITAL_COMMUNITY): Payer: Self-pay

## 2020-11-25 MED FILL — Elvitegrav-Cobic-Emtricitab-Tenofov AF Tab 150-150-200-10 MG: ORAL | 30 days supply | Qty: 30 | Fill #0 | Status: CN

## 2020-11-25 MED FILL — Elvitegrav-Cobic-Emtricitab-Tenofov AF Tab 150-150-200-10 MG: ORAL | 30 days supply | Qty: 30 | Fill #0 | Status: AC

## 2020-12-04 ENCOUNTER — Other Ambulatory Visit (HOSPITAL_COMMUNITY): Payer: Self-pay

## 2021-01-01 ENCOUNTER — Other Ambulatory Visit (HOSPITAL_BASED_OUTPATIENT_CLINIC_OR_DEPARTMENT_OTHER): Payer: Self-pay

## 2021-01-06 ENCOUNTER — Other Ambulatory Visit (HOSPITAL_COMMUNITY): Payer: Self-pay

## 2021-01-06 MED FILL — Elvitegrav-Cobic-Emtricitab-Tenofov AF Tab 150-150-200-10 MG: ORAL | 30 days supply | Qty: 30 | Fill #1 | Status: AC

## 2021-01-13 ENCOUNTER — Other Ambulatory Visit (HOSPITAL_COMMUNITY): Payer: Self-pay

## 2021-02-06 ENCOUNTER — Other Ambulatory Visit (HOSPITAL_COMMUNITY): Payer: Self-pay

## 2021-02-12 ENCOUNTER — Other Ambulatory Visit (HOSPITAL_COMMUNITY): Payer: Self-pay

## 2021-02-12 MED FILL — Elvitegrav-Cobic-Emtricitab-Tenofov AF Tab 150-150-200-10 MG: ORAL | 30 days supply | Qty: 30 | Fill #2 | Status: AC

## 2021-03-10 ENCOUNTER — Other Ambulatory Visit (HOSPITAL_COMMUNITY): Payer: Self-pay

## 2021-03-13 ENCOUNTER — Other Ambulatory Visit (HOSPITAL_COMMUNITY): Payer: Self-pay

## 2021-03-13 MED FILL — Elvitegrav-Cobic-Emtricitab-Tenofov AF Tab 150-150-200-10 MG: ORAL | 30 days supply | Qty: 30 | Fill #3 | Status: AC

## 2021-03-16 ENCOUNTER — Other Ambulatory Visit (HOSPITAL_COMMUNITY): Payer: Self-pay

## 2021-03-16 ENCOUNTER — Telehealth: Payer: Self-pay

## 2021-03-16 MED ORDER — BIKTARVY 50-200-25 MG PO TABS
1.0000 | ORAL_TABLET | Freq: Every day | ORAL | 0 refills | Status: DC
Start: 2021-03-15 — End: 2021-06-01

## 2021-03-16 NOTE — Telephone Encounter (Signed)
[facility] Patient Advocate Encounter  Completed and sent [employer] Advancing Access application for Genvoya for this patient who is uninsured.    Patient is approved 03/16/21.        Clearance Coots, CPhT Specialty Pharmacy Patient [facility] Phone: [phone] Fax:  [phone]

## 2021-04-07 ENCOUNTER — Other Ambulatory Visit (HOSPITAL_COMMUNITY): Payer: Self-pay

## 2021-04-07 MED FILL — Elvitegrav-Cobic-Emtricitab-Tenofov AF Tab 150-150-200-10 MG: ORAL | 30 days supply | Qty: 30 | Fill #4 | Status: AC

## 2021-04-13 ENCOUNTER — Other Ambulatory Visit (HOSPITAL_COMMUNITY): Payer: Self-pay

## 2021-05-07 ENCOUNTER — Other Ambulatory Visit (HOSPITAL_COMMUNITY): Payer: Self-pay

## 2021-05-07 MED FILL — Elvitegrav-Cobic-Emtricitab-Tenofov AF Tab 150-150-200-10 MG: ORAL | 30 days supply | Qty: 30 | Fill #5 | Status: AC

## 2021-05-12 ENCOUNTER — Other Ambulatory Visit (HOSPITAL_COMMUNITY): Payer: Self-pay

## 2021-05-18 ENCOUNTER — Other Ambulatory Visit: Payer: Self-pay

## 2021-05-18 ENCOUNTER — Other Ambulatory Visit: Payer: 59

## 2021-05-18 DIAGNOSIS — B20 Human immunodeficiency virus [HIV] disease: Secondary | ICD-10-CM

## 2021-05-20 LAB — HELPER T-LYMPH-CD4 (ARMC ONLY)
% CD 4 Pos. Lymph.: 40.7 % (ref 30.8–58.5)
Absolute CD 4 Helper: 773 /uL (ref 359–1519)
Basophils Absolute: 0 10*3/uL (ref 0.0–0.2)
Basos: 1 %
EOS (ABSOLUTE): 0.2 10*3/uL (ref 0.0–0.4)
Eos: 3 %
Hematocrit: 51.7 % — ABNORMAL HIGH (ref 37.5–51.0)
Hemoglobin: 17.2 g/dL (ref 13.0–17.7)
Immature Grans (Abs): 0 10*3/uL (ref 0.0–0.1)
Immature Granulocytes: 0 %
Lymphocytes Absolute: 1.9 10*3/uL (ref 0.7–3.1)
Lymphs: 32 %
MCH: 30.2 pg (ref 26.6–33.0)
MCHC: 33.3 g/dL (ref 31.5–35.7)
MCV: 91 fL (ref 79–97)
Monocytes Absolute: 0.5 10*3/uL (ref 0.1–0.9)
Monocytes: 8 %
Neutrophils Absolute: 3.3 10*3/uL (ref 1.4–7.0)
Neutrophils: 56 %
Platelets: 269 10*3/uL (ref 150–450)
RBC: 5.69 x10E6/uL (ref 4.14–5.80)
RDW: 13 % (ref 11.6–15.4)
WBC: 5.9 10*3/uL (ref 3.4–10.8)

## 2021-05-21 LAB — CBC WITH DIFFERENTIAL/PLATELET
Absolute Monocytes: 528 cells/uL (ref 200–950)
Basophils Absolute: 29 cells/uL (ref 0–200)
Basophils Relative: 0.5 %
Eosinophils Absolute: 157 cells/uL (ref 15–500)
Eosinophils Relative: 2.7 %
HCT: 50 % (ref 38.5–50.0)
Hemoglobin: 17.1 g/dL (ref 13.2–17.1)
Lymphs Abs: 1821 cells/uL (ref 850–3900)
MCH: 30.4 pg (ref 27.0–33.0)
MCHC: 34.2 g/dL (ref 32.0–36.0)
MCV: 88.8 fL (ref 80.0–100.0)
MPV: 10.2 fL (ref 7.5–12.5)
Monocytes Relative: 9.1 %
Neutro Abs: 3265 cells/uL (ref 1500–7800)
Neutrophils Relative %: 56.3 %
Platelets: 265 10*3/uL (ref 140–400)
RBC: 5.63 10*6/uL (ref 4.20–5.80)
RDW: 13 % (ref 11.0–15.0)
Total Lymphocyte: 31.4 %
WBC: 5.8 10*3/uL (ref 3.8–10.8)

## 2021-05-21 LAB — COMPLETE METABOLIC PANEL WITH GFR
AG Ratio: 1.5 (calc) (ref 1.0–2.5)
ALT: 56 U/L — ABNORMAL HIGH (ref 9–46)
AST: 43 U/L — ABNORMAL HIGH (ref 10–35)
Albumin: 4.3 g/dL (ref 3.6–5.1)
Alkaline phosphatase (APISO): 97 U/L (ref 35–144)
BUN: 14 mg/dL (ref 7–25)
CO2: 26 mmol/L (ref 20–32)
Calcium: 9.5 mg/dL (ref 8.6–10.3)
Chloride: 105 mmol/L (ref 98–110)
Creat: 0.91 mg/dL (ref 0.70–1.30)
Globulin: 2.9 g/dL (calc) (ref 1.9–3.7)
Glucose, Bld: 106 mg/dL — ABNORMAL HIGH (ref 65–99)
Potassium: 4.3 mmol/L (ref 3.5–5.3)
Sodium: 139 mmol/L (ref 135–146)
Total Bilirubin: 0.7 mg/dL (ref 0.2–1.2)
Total Protein: 7.2 g/dL (ref 6.1–8.1)
eGFR: 101 mL/min/{1.73_m2} (ref >=60)

## 2021-05-21 LAB — RPR: RPR Ser Ql: REACTIVE — AB

## 2021-05-21 LAB — FLUORESCENT TREPONEMAL AB(FTA)-IGG-BLD: Fluorescent Treponemal ABS: REACTIVE — AB

## 2021-05-21 LAB — RPR TITER: RPR Titer: 1:1 {titer} — ABNORMAL HIGH

## 2021-05-21 LAB — LIPID PANEL
Cholesterol: 197 mg/dL (ref <200)
HDL: 41 mg/dL (ref >=40)
LDL Cholesterol (Calc): 135 mg/dL (calc) — ABNORMAL HIGH
Non-HDL Cholesterol (Calc): 156 mg/dL (calc) — ABNORMAL HIGH (ref <130)
Total CHOL/HDL Ratio: 4.8 (calc) (ref <5.0)
Triglycerides: 104 mg/dL (ref <150)

## 2021-05-21 LAB — HIV-1 RNA QUANT-NO REFLEX-BLD
HIV 1 RNA Quant: NOT DETECTED Copies/mL
HIV-1 RNA Quant, Log: NOT DETECTED Log cps/mL

## 2021-06-01 ENCOUNTER — Encounter: Payer: Self-pay | Admitting: Infectious Disease

## 2021-06-01 ENCOUNTER — Other Ambulatory Visit: Payer: Self-pay

## 2021-06-01 ENCOUNTER — Ambulatory Visit (INDEPENDENT_AMBULATORY_CARE_PROVIDER_SITE_OTHER): Payer: 59 | Admitting: Infectious Disease

## 2021-06-01 ENCOUNTER — Other Ambulatory Visit (HOSPITAL_COMMUNITY): Payer: Self-pay

## 2021-06-01 VITALS — BP 139/87 | HR 59 | Temp 97.9°F | Ht 71.0 in | Wt 200.0 lb

## 2021-06-01 DIAGNOSIS — R7401 Elevation of levels of liver transaminase levels: Secondary | ICD-10-CM

## 2021-06-01 DIAGNOSIS — B20 Human immunodeficiency virus [HIV] disease: Secondary | ICD-10-CM

## 2021-06-01 DIAGNOSIS — K759 Inflammatory liver disease, unspecified: Secondary | ICD-10-CM

## 2021-06-01 DIAGNOSIS — I10 Essential (primary) hypertension: Secondary | ICD-10-CM

## 2021-06-01 DIAGNOSIS — Z8619 Personal history of other infectious and parasitic diseases: Secondary | ICD-10-CM

## 2021-06-01 DIAGNOSIS — Z23 Encounter for immunization: Secondary | ICD-10-CM

## 2021-06-01 DIAGNOSIS — R635 Abnormal weight gain: Secondary | ICD-10-CM

## 2021-06-01 DIAGNOSIS — Z7185 Encounter for immunization safety counseling: Secondary | ICD-10-CM

## 2021-06-01 HISTORY — DX: Elevation of levels of liver transaminase levels: R74.01

## 2021-06-01 HISTORY — DX: Inflammatory liver disease, unspecified: K75.9

## 2021-06-01 HISTORY — DX: Abnormal weight gain: R63.5

## 2021-06-01 MED ORDER — ELVITEG-COBIC-EMTRICIT-TENOFAF 150-150-200-10 MG PO TABS
1.0000 | ORAL_TABLET | Freq: Every day | ORAL | 11 refills | Status: DC
  Filled 2021-06-01 – 2021-06-04 (×2): qty 30, 30d supply, fill #0
  Filled 2021-07-01: qty 30, 30d supply, fill #1
  Filled 2021-08-03: qty 30, 30d supply, fill #2
  Filled 2021-08-31: qty 30, 30d supply, fill #3
  Filled 2021-10-05: qty 30, 30d supply, fill #4
  Filled 2021-11-02: qty 30, 30d supply, fill #5
  Filled 2021-12-02: qty 30, 30d supply, fill #6
  Filled 2021-12-30: qty 30, 30d supply, fill #7
  Filled 2022-02-01: qty 30, 30d supply, fill #8
  Filled 2022-03-04: qty 30, 30d supply, fill #9
  Filled 2022-04-01: qty 30, 30d supply, fill #10
  Filled 2022-05-04: qty 30, 30d supply, fill #11
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
## 2021-06-02 LAB — HEPATITIS PANEL, ACUTE
Hep A IgM: NONREACTIVE
Hep B C IgM: NONREACTIVE
Hepatitis B Surface Ag: NONREACTIVE
Hepatitis C Ab: NONREACTIVE
SIGNAL TO CUT-OFF: 0.02 (ref <1.00)

## 2021-06-03 ENCOUNTER — Telehealth: Payer: Self-pay

## 2021-06-03 ENCOUNTER — Other Ambulatory Visit: Payer: Self-pay | Admitting: Infectious Disease

## 2021-06-03 DIAGNOSIS — R7401 Elevation of levels of liver transaminase levels: Secondary | ICD-10-CM

## 2021-06-03 NOTE — Telephone Encounter (Signed)
Called patient to discuss lab results and [place] orders. Explained that there are numerous reasons liver enzymes could be elevated and the ultrasound will hopefully give [place] some indication as to why. Orders placed for [place]. Forwarding to referral coordinator to assist with scheduling.   Renay Crammer Loyola Mast, RN

## 2021-06-03 NOTE — Telephone Encounter (Signed)
-----   Message from Randall Hiss, MD sent at 06/03/2021  9:03 AM EDT ----- Hepatitis panel negative, ordering RUQ [place] due to his slight transaminitis ----- Message ----- From: Janace Hoard Lab Results In Sent: 06/02/2021  11:38 AM EDT To: Randall Hiss, MD

## 2021-06-04 ENCOUNTER — Other Ambulatory Visit (HOSPITAL_COMMUNITY): Payer: Self-pay

## 2021-06-08 ENCOUNTER — Other Ambulatory Visit (HOSPITAL_COMMUNITY): Payer: Self-pay

## 2021-06-29 ENCOUNTER — Encounter (HOSPITAL_COMMUNITY): Payer: Self-pay

## 2021-06-29 ENCOUNTER — Ambulatory Visit (HOSPITAL_COMMUNITY): Admission: RE | Admit: 2021-06-29 | Payer: 59 | Source: Ambulatory Visit

## 2021-07-01 ENCOUNTER — Other Ambulatory Visit (HOSPITAL_COMMUNITY): Payer: Self-pay

## 2021-07-07 ENCOUNTER — Other Ambulatory Visit (HOSPITAL_COMMUNITY): Payer: Self-pay

## 2021-08-03 ENCOUNTER — Other Ambulatory Visit (HOSPITAL_COMMUNITY): Payer: Self-pay

## 2021-08-11 ENCOUNTER — Other Ambulatory Visit (HOSPITAL_COMMUNITY): Payer: Self-pay

## 2021-08-31 ENCOUNTER — Other Ambulatory Visit (HOSPITAL_COMMUNITY): Payer: Self-pay

## 2021-09-09 ENCOUNTER — Other Ambulatory Visit (HOSPITAL_COMMUNITY): Payer: Self-pay

## 2021-10-02 ENCOUNTER — Other Ambulatory Visit (HOSPITAL_COMMUNITY): Payer: Self-pay

## 2021-10-05 ENCOUNTER — Other Ambulatory Visit (HOSPITAL_COMMUNITY): Payer: Self-pay

## 2021-10-07 ENCOUNTER — Other Ambulatory Visit (HOSPITAL_COMMUNITY): Payer: Self-pay

## 2021-10-29 ENCOUNTER — Other Ambulatory Visit (HOSPITAL_COMMUNITY): Payer: Self-pay

## 2021-11-02 ENCOUNTER — Other Ambulatory Visit (HOSPITAL_COMMUNITY): Payer: Self-pay

## 2021-11-09 ENCOUNTER — Other Ambulatory Visit (HOSPITAL_COMMUNITY): Payer: Self-pay

## 2021-11-30 ENCOUNTER — Other Ambulatory Visit (HOSPITAL_COMMUNITY): Payer: Self-pay

## 2021-12-02 ENCOUNTER — Other Ambulatory Visit (HOSPITAL_COMMUNITY): Payer: Self-pay

## 2021-12-08 ENCOUNTER — Other Ambulatory Visit (HOSPITAL_COMMUNITY): Payer: Self-pay

## 2021-12-30 ENCOUNTER — Other Ambulatory Visit (HOSPITAL_COMMUNITY): Payer: Self-pay

## 2022-01-07 ENCOUNTER — Other Ambulatory Visit (HOSPITAL_COMMUNITY): Payer: Self-pay

## 2022-01-28 ENCOUNTER — Other Ambulatory Visit (HOSPITAL_COMMUNITY): Payer: Self-pay

## 2022-02-01 ENCOUNTER — Other Ambulatory Visit (HOSPITAL_COMMUNITY): Payer: Self-pay

## 2022-02-08 ENCOUNTER — Other Ambulatory Visit (HOSPITAL_COMMUNITY): Payer: Self-pay

## 2022-03-04 ENCOUNTER — Other Ambulatory Visit (HOSPITAL_COMMUNITY): Payer: Self-pay

## 2022-03-05 ENCOUNTER — Other Ambulatory Visit (HOSPITAL_COMMUNITY): Payer: Self-pay

## 2022-03-29 ENCOUNTER — Other Ambulatory Visit (HOSPITAL_COMMUNITY): Payer: Self-pay

## 2022-03-31 ENCOUNTER — Other Ambulatory Visit (HOSPITAL_COMMUNITY): Payer: Self-pay

## 2022-04-01 ENCOUNTER — Other Ambulatory Visit (HOSPITAL_COMMUNITY): Payer: Self-pay

## 2022-04-02 ENCOUNTER — Other Ambulatory Visit (HOSPITAL_COMMUNITY): Payer: Self-pay

## 2022-05-04 ENCOUNTER — Other Ambulatory Visit (HOSPITAL_COMMUNITY): Payer: Self-pay

## 2022-05-12 ENCOUNTER — Other Ambulatory Visit (HOSPITAL_COMMUNITY): Payer: Self-pay

## 2022-05-17 ENCOUNTER — Other Ambulatory Visit: Payer: 59

## 2022-05-17 ENCOUNTER — Other Ambulatory Visit: Payer: Self-pay

## 2022-05-17 DIAGNOSIS — B20 Human immunodeficiency virus [HIV] disease: Secondary | ICD-10-CM

## 2022-05-18 LAB — T-HELPER CELL (CD4) - (RCID CLINIC ONLY)
CD4 % Helper T Cell: 49 % (ref 33–65)
CD4 T Cell Abs: 946 /uL (ref 400–1790)

## 2022-05-19 LAB — CBC WITH DIFFERENTIAL/PLATELET
Absolute Monocytes: 576 cells/uL (ref 200–950)
Basophils Absolute: 42 cells/uL (ref 0–200)
Basophils Relative: 0.7 %
Eosinophils Absolute: 228 cells/uL (ref 15–500)
Eosinophils Relative: 3.8 %
HCT: 48.4 % (ref 38.5–50.0)
Hemoglobin: 16.4 g/dL (ref 13.2–17.1)
Lymphs Abs: 2112 cells/uL (ref 850–3900)
MCH: 30.4 pg (ref 27.0–33.0)
MCHC: 33.9 g/dL (ref 32.0–36.0)
MCV: 89.6 fL (ref 80.0–100.0)
MPV: 10 fL (ref 7.5–12.5)
Monocytes Relative: 9.6 %
Neutro Abs: 3042 cells/uL (ref 1500–7800)
Neutrophils Relative %: 50.7 %
Platelets: 246 10*3/uL (ref 140–400)
RBC: 5.4 10*6/uL (ref 4.20–5.80)
RDW: 13.4 % (ref 11.0–15.0)
Total Lymphocyte: 35.2 %
WBC: 6 10*3/uL (ref 3.8–10.8)

## 2022-05-19 LAB — LIPID PANEL
Cholesterol: 186 mg/dL (ref <200)
HDL: 40 mg/dL (ref >=40)
LDL Cholesterol (Calc): 126 mg/dL (calc) — ABNORMAL HIGH
Non-HDL Cholesterol (Calc): 146 mg/dL (calc) — ABNORMAL HIGH (ref <130)
Total CHOL/HDL Ratio: 4.7 (calc) (ref <5.0)
Triglycerides: 92 mg/dL (ref <150)

## 2022-05-19 LAB — COMPLETE METABOLIC PANEL WITH GFR
AG Ratio: 1.5 (calc) (ref 1.0–2.5)
ALT: 39 U/L (ref 9–46)
AST: 26 U/L (ref 10–35)
Albumin: 4.3 g/dL (ref 3.6–5.1)
Alkaline phosphatase (APISO): 89 U/L (ref 35–144)
BUN: 14 mg/dL (ref 7–25)
CO2: 26 mmol/L (ref 20–32)
Calcium: 8.8 mg/dL (ref 8.6–10.3)
Chloride: 106 mmol/L (ref 98–110)
Creat: 0.92 mg/dL (ref 0.70–1.30)
Globulin: 2.8 g/dL (calc) (ref 1.9–3.7)
Glucose, Bld: 111 mg/dL — ABNORMAL HIGH (ref 65–99)
Potassium: 4.2 mmol/L (ref 3.5–5.3)
Sodium: 140 mmol/L (ref 135–146)
Total Bilirubin: 0.5 mg/dL (ref 0.2–1.2)
Total Protein: 7.1 g/dL (ref 6.1–8.1)
eGFR: 99 mL/min/{1.73_m2} (ref >=60)

## 2022-05-19 LAB — FLUORESCENT TREPONEMAL AB(FTA)-IGG-BLD: Fluorescent Treponemal ABS: REACTIVE — AB

## 2022-05-19 LAB — RPR: RPR Ser Ql: REACTIVE — AB

## 2022-05-19 LAB — RPR TITER: RPR Titer: 1:2 {titer} — ABNORMAL HIGH

## 2022-05-19 LAB — HIV-1 RNA QUANT-NO REFLEX-BLD
HIV 1 RNA Quant: <20 Copies/mL — ABNORMAL HIGH
HIV-1 RNA Quant, Log: <1.3 Log cps/mL — ABNORMAL HIGH

## 2022-05-31 ENCOUNTER — Encounter: Payer: Self-pay | Admitting: Infectious Disease

## 2022-05-31 ENCOUNTER — Other Ambulatory Visit: Payer: Self-pay

## 2022-05-31 ENCOUNTER — Other Ambulatory Visit (HOSPITAL_COMMUNITY): Payer: Self-pay

## 2022-05-31 ENCOUNTER — Ambulatory Visit: Payer: 59 | Admitting: Infectious Disease

## 2022-05-31 VITALS — BP 151/88 | HR 59 | Temp 97.4°F | Ht 71.0 in | Wt 200.0 lb

## 2022-05-31 DIAGNOSIS — R635 Abnormal weight gain: Secondary | ICD-10-CM | POA: Diagnosis not present

## 2022-05-31 DIAGNOSIS — Z23 Encounter for immunization: Secondary | ICD-10-CM

## 2022-05-31 DIAGNOSIS — B20 Human immunodeficiency virus [HIV] disease: Secondary | ICD-10-CM

## 2022-05-31 DIAGNOSIS — Z113 Encounter for screening for infections with a predominantly sexual mode of transmission: Secondary | ICD-10-CM

## 2022-05-31 DIAGNOSIS — F17210 Nicotine dependence, cigarettes, uncomplicated: Secondary | ICD-10-CM | POA: Diagnosis not present

## 2022-05-31 DIAGNOSIS — E785 Hyperlipidemia, unspecified: Secondary | ICD-10-CM

## 2022-05-31 DIAGNOSIS — F17200 Nicotine dependence, unspecified, uncomplicated: Secondary | ICD-10-CM

## 2022-05-31 DIAGNOSIS — Z8619 Personal history of other infectious and parasitic diseases: Secondary | ICD-10-CM

## 2022-05-31 DIAGNOSIS — R739 Hyperglycemia, unspecified: Secondary | ICD-10-CM

## 2022-05-31 HISTORY — DX: Hyperlipidemia, unspecified: E78.5

## 2022-05-31 MED ORDER — ATORVASTATIN CALCIUM 10 MG PO TABS
10.0000 mg | ORAL_TABLET | Freq: Every day | ORAL | 11 refills | Status: DC
  Filled 2022-05-31: qty 30, 30d supply, fill #0

## 2022-05-31 MED ORDER — ELVITEG-COBIC-EMTRICIT-TENOFAF 150-150-200-10 MG PO TABS
1.0000 | ORAL_TABLET | Freq: Every day | ORAL | 11 refills | Status: DC
  Filled 2022-05-31 (×2): qty 30, 30d supply, fill #0
  Filled 2022-06-29: qty 30, 30d supply, fill #1
  Filled 2022-07-30: qty 30, 30d supply, fill #2
  Filled 2022-08-24: qty 30, 30d supply, fill #3
  Filled 2022-09-22: qty 30, 30d supply, fill #4
  Filled 2022-10-26: qty 30, 30d supply, fill #5
  Filled 2022-11-29: qty 30, 30d supply, fill #6
  Filled 2023-01-03: qty 30, 30d supply, fill #7
  Filled 2023-02-02: qty 30, 30d supply, fill #8
  Filled 2023-03-02: qty 30, 30d supply, fill #9
  Filled 2023-04-01: qty 30, 30d supply, fill #10
  Filled 2023-05-02: qty 30, 30d supply, fill #11
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
## 2022-06-01 LAB — HEMOGLOBIN A1C
Hgb A1c MFr Bld: 5.5 % of total Hgb (ref <5.7)
Mean Plasma Glucose: 111 mg/dL
eAG (mmol/L): 6.2 mmol/L

## 2022-06-09 ENCOUNTER — Other Ambulatory Visit (HOSPITAL_COMMUNITY): Payer: Self-pay

## 2022-06-15 ENCOUNTER — Other Ambulatory Visit (HOSPITAL_COMMUNITY): Payer: Self-pay

## 2022-06-29 ENCOUNTER — Other Ambulatory Visit (HOSPITAL_COMMUNITY): Payer: Self-pay

## 2022-07-06 ENCOUNTER — Other Ambulatory Visit (HOSPITAL_COMMUNITY): Payer: Self-pay

## 2022-07-29 ENCOUNTER — Other Ambulatory Visit (HOSPITAL_COMMUNITY): Payer: Self-pay

## 2022-07-30 ENCOUNTER — Other Ambulatory Visit: Payer: Self-pay

## 2022-08-02 ENCOUNTER — Other Ambulatory Visit (HOSPITAL_COMMUNITY): Payer: Self-pay

## 2022-08-03 ENCOUNTER — Other Ambulatory Visit (HOSPITAL_COMMUNITY): Payer: Self-pay

## 2022-08-03 ENCOUNTER — Other Ambulatory Visit: Payer: Self-pay

## 2022-08-24 ENCOUNTER — Other Ambulatory Visit (HOSPITAL_COMMUNITY): Payer: Self-pay

## 2022-09-01 ENCOUNTER — Other Ambulatory Visit (HOSPITAL_COMMUNITY): Payer: Self-pay

## 2022-09-22 ENCOUNTER — Other Ambulatory Visit (HOSPITAL_COMMUNITY): Payer: Self-pay

## 2022-09-29 ENCOUNTER — Other Ambulatory Visit: Payer: Self-pay

## 2022-10-21 ENCOUNTER — Other Ambulatory Visit (HOSPITAL_COMMUNITY): Payer: Self-pay

## 2022-10-25 ENCOUNTER — Other Ambulatory Visit (HOSPITAL_COMMUNITY): Payer: Self-pay

## 2022-10-26 ENCOUNTER — Other Ambulatory Visit (HOSPITAL_COMMUNITY): Payer: Self-pay

## 2022-10-27 ENCOUNTER — Other Ambulatory Visit (HOSPITAL_COMMUNITY): Payer: Self-pay

## 2022-11-29 ENCOUNTER — Other Ambulatory Visit (HOSPITAL_COMMUNITY): Payer: Self-pay

## 2022-12-06 ENCOUNTER — Other Ambulatory Visit: Payer: Self-pay

## 2022-12-30 ENCOUNTER — Other Ambulatory Visit (HOSPITAL_COMMUNITY): Payer: Self-pay

## 2023-01-03 ENCOUNTER — Other Ambulatory Visit: Payer: Self-pay

## 2023-01-03 ENCOUNTER — Other Ambulatory Visit (HOSPITAL_COMMUNITY): Payer: Self-pay

## 2023-01-05 ENCOUNTER — Other Ambulatory Visit (HOSPITAL_COMMUNITY): Payer: Self-pay

## 2023-02-02 ENCOUNTER — Other Ambulatory Visit (HOSPITAL_COMMUNITY): Payer: Self-pay

## 2023-02-07 ENCOUNTER — Other Ambulatory Visit (HOSPITAL_COMMUNITY): Payer: Self-pay

## 2023-03-02 ENCOUNTER — Other Ambulatory Visit (HOSPITAL_COMMUNITY): Payer: Self-pay

## 2023-03-07 ENCOUNTER — Other Ambulatory Visit (HOSPITAL_COMMUNITY): Payer: Self-pay

## 2023-03-30 ENCOUNTER — Other Ambulatory Visit (HOSPITAL_COMMUNITY): Payer: Self-pay

## 2023-04-01 ENCOUNTER — Other Ambulatory Visit (HOSPITAL_COMMUNITY): Payer: Self-pay

## 2023-04-05 ENCOUNTER — Other Ambulatory Visit (HOSPITAL_COMMUNITY): Payer: Self-pay

## 2023-04-29 ENCOUNTER — Other Ambulatory Visit (HOSPITAL_COMMUNITY): Payer: Self-pay

## 2023-05-02 ENCOUNTER — Other Ambulatory Visit (HOSPITAL_COMMUNITY): Payer: Self-pay

## 2023-05-23 ENCOUNTER — Other Ambulatory Visit: Payer: BC Managed Care – PPO

## 2023-05-23 ENCOUNTER — Other Ambulatory Visit: Payer: Self-pay

## 2023-05-23 DIAGNOSIS — B20 Human immunodeficiency virus [HIV] disease: Secondary | ICD-10-CM

## 2023-05-24 LAB — T-HELPER CELL (CD4) - (RCID CLINIC ONLY)
CD4 % Helper T Cell: 42 % (ref 33–65)
CD4 T Cell Abs: 869 /uL (ref 400–1790)

## 2023-05-27 LAB — RPR TITER: RPR Titer: 1:1 {titer} — ABNORMAL HIGH

## 2023-05-27 LAB — LIPID PANEL
Cholesterol: 178 mg/dL (ref <200)
HDL: 41 mg/dL (ref >=40)
LDL Cholesterol (Calc): 116 mg/dL — ABNORMAL HIGH
Non-HDL Cholesterol (Calc): 137 mg/dL — ABNORMAL HIGH (ref <130)
Total CHOL/HDL Ratio: 4.3 (calc) (ref <5.0)
Triglycerides: 104 mg/dL (ref <150)

## 2023-05-27 LAB — CBC WITH DIFFERENTIAL/PLATELET
Absolute Monocytes: 703 {cells}/uL (ref 200–950)
Basophils Absolute: 37 {cells}/uL (ref 0–200)
Basophils Relative: 0.5 %
Eosinophils Absolute: 200 {cells}/uL (ref 15–500)
Eosinophils Relative: 2.7 %
HCT: 48.4 % (ref 38.5–50.0)
Hemoglobin: 16 g/dL (ref 13.2–17.1)
Lymphs Abs: 2235 {cells}/uL (ref 850–3900)
MCH: 30.3 pg (ref 27.0–33.0)
MCHC: 33.1 g/dL (ref 32.0–36.0)
MCV: 91.7 fL (ref 80.0–100.0)
MPV: 10.6 fL (ref 7.5–12.5)
Monocytes Relative: 9.5 %
Neutro Abs: 4225 {cells}/uL (ref 1500–7800)
Neutrophils Relative %: 57.1 %
Platelets: 253 10*3/uL (ref 140–400)
RBC: 5.28 10*6/uL (ref 4.20–5.80)
RDW: 13.1 % (ref 11.0–15.0)
Total Lymphocyte: 30.2 %
WBC: 7.4 10*3/uL (ref 3.8–10.8)

## 2023-05-27 LAB — T PALLIDUM AB: T Pallidum Abs: POSITIVE — AB

## 2023-05-27 LAB — COMPLETE METABOLIC PANEL WITH GFR
AG Ratio: 1.5 (calc) (ref 1.0–2.5)
ALT: 37 U/L (ref 9–46)
AST: 30 U/L (ref 10–35)
Albumin: 4.4 g/dL (ref 3.6–5.1)
Alkaline phosphatase (APISO): 95 U/L (ref 35–144)
BUN: 17 mg/dL (ref 7–25)
CO2: 24 mmol/L (ref 20–32)
Calcium: 9.3 mg/dL (ref 8.6–10.3)
Chloride: 104 mmol/L (ref 98–110)
Creat: 0.9 mg/dL (ref 0.70–1.30)
Globulin: 3 g/dL (ref 1.9–3.7)
Glucose, Bld: 117 mg/dL — ABNORMAL HIGH (ref 65–99)
Potassium: 4 mmol/L (ref 3.5–5.3)
Sodium: 138 mmol/L (ref 135–146)
Total Bilirubin: 0.8 mg/dL (ref 0.2–1.2)
Total Protein: 7.4 g/dL (ref 6.1–8.1)
eGFR: 101 mL/min/{1.73_m2} (ref >=60)

## 2023-05-27 LAB — HIV-1 RNA QUANT-NO REFLEX-BLD
HIV 1 RNA Quant: <20 {copies}/mL — ABNORMAL HIGH
HIV-1 RNA Quant, Log: <1.3 {Log} — ABNORMAL HIGH

## 2023-05-27 LAB — RPR: RPR Ser Ql: REACTIVE — AB

## 2023-05-31 ENCOUNTER — Other Ambulatory Visit: Payer: Self-pay

## 2023-05-31 ENCOUNTER — Other Ambulatory Visit: Payer: Self-pay | Admitting: Infectious Disease

## 2023-05-31 ENCOUNTER — Other Ambulatory Visit (HOSPITAL_COMMUNITY): Payer: Self-pay

## 2023-05-31 DIAGNOSIS — B20 Human immunodeficiency virus [HIV] disease: Secondary | ICD-10-CM

## 2023-05-31 MED ORDER — GENVOYA 150-150-200-10 MG PO TABS
1.0000 | ORAL_TABLET | Freq: Every day | ORAL | 0 refills | Status: DC
  Filled 2023-05-31: qty 30, 30d supply, fill #0

## 2023-05-31 NOTE — Progress Notes (Signed)
Specialty Pharmacy Refill Coordination Note  CIRO TASHIRO is a 57 y.o. male contacted today regarding refills of specialty medication(s) Elviteg-Cobic-Emtricit-Tenofaf   Patient requested Daryll Drown at [facility] at [place] date: 06/06/23   Medication will be filled on 06/02/20 pending refill request.
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
## 2023-06-06 ENCOUNTER — Other Ambulatory Visit (HOSPITAL_COMMUNITY): Payer: Self-pay

## 2023-06-06 ENCOUNTER — Other Ambulatory Visit: Payer: Self-pay

## 2023-06-06 ENCOUNTER — Ambulatory Visit: Payer: BC Managed Care – PPO | Admitting: Infectious Disease

## 2023-06-06 ENCOUNTER — Encounter: Payer: Self-pay | Admitting: Infectious Disease

## 2023-06-06 VITALS — BP 159/102 | HR 62 | Temp 98.0°F | Resp 16 | Wt 207.8 lb

## 2023-06-06 DIAGNOSIS — F17200 Nicotine dependence, unspecified, uncomplicated: Secondary | ICD-10-CM

## 2023-06-06 DIAGNOSIS — B20 Human immunodeficiency virus [HIV] disease: Secondary | ICD-10-CM | POA: Diagnosis not present

## 2023-06-06 DIAGNOSIS — Z113 Encounter for screening for infections with a predominantly sexual mode of transmission: Secondary | ICD-10-CM

## 2023-06-06 DIAGNOSIS — Z23 Encounter for immunization: Secondary | ICD-10-CM | POA: Diagnosis not present

## 2023-06-06 DIAGNOSIS — E785 Hyperlipidemia, unspecified: Secondary | ICD-10-CM

## 2023-06-06 MED ORDER — GENVOYA 150-150-200-10 MG PO TABS
1.0000 | ORAL_TABLET | Freq: Every day | ORAL | 11 refills | Status: DC
  Filled 2023-06-06: qty 30, 30d supply, fill #0
  Filled 2023-06-22: qty 30, 30d supply, fill #1
  Filled 2023-07-25: qty 30, 30d supply, fill #2
  Filled 2023-08-29: qty 30, 30d supply, fill #3
  Filled 2023-10-03: qty 30, 30d supply, fill #4
  Filled 2023-11-01: qty 30, 30d supply, fill #5
  Filled 2023-11-29: qty 30, 30d supply, fill #6
  Filled 2023-12-26 (×2): qty 30, 30d supply, fill #7
  Filled 2024-01-30 – 2024-01-31 (×4): qty 30, 30d supply, fill #8
  Filled 2024-02-29: qty 30, 30d supply, fill #9
  Filled 2024-03-20: qty 30, 30d supply, fill #10

## 2023-06-22 ENCOUNTER — Other Ambulatory Visit (HOSPITAL_COMMUNITY): Payer: Self-pay

## 2023-06-22 ENCOUNTER — Other Ambulatory Visit: Payer: Self-pay

## 2023-06-22 ENCOUNTER — Other Ambulatory Visit (HOSPITAL_COMMUNITY): Payer: Self-pay | Admitting: Pharmacy Technician

## 2023-06-22 NOTE — Progress Notes (Signed)
Specialty Pharmacy Refill Coordination Note  Ryan Rich is a 56 y.o. male contacted today regarding refills of specialty medication(s) Elviteg-Cobic-Emtricit-Tenofaf   Patient requested Delivery   Delivery date: 07/04/23   Verified address: [address] [place]   Medication will be filled on 07/01/23.

## 2023-06-22 NOTE — Progress Notes (Signed)
Specialty Pharmacy Ongoing Clinical Assessment Note  Ryan Rich is a 57 y.o. male who is being followed by the specialty pharmacy service for RxSp HIV   Patient's specialty medication(s) reviewed today: Elviteg-Cobic-Emtricit-Tenofaf   Missed doses in the last 4 weeks: 0   Patient/Caregiver did not have any additional questions or concerns.   Therapeutic benefit summary: Patient is achieving benefit   Adverse events/side effects summary: No adverse events/side effects   Patient's therapy is appropriate to: Continue    Goals Addressed             This Visit's Progress    Achieve Undetectable HIV Viral Load < 20       Patient is on track. Patient will maintain adherence. Viral load remains undetectable long term.          Follow up:  6 months  Otto Herb Specialty Pharmacist

## 2023-07-01 ENCOUNTER — Other Ambulatory Visit: Payer: Self-pay

## 2023-07-25 ENCOUNTER — Other Ambulatory Visit: Payer: Self-pay

## 2023-07-25 ENCOUNTER — Other Ambulatory Visit (HOSPITAL_COMMUNITY): Payer: Self-pay

## 2023-07-25 NOTE — Progress Notes (Signed)
Specialty Pharmacy Refill Coordination Note  Ryan Rich is a 57 y.o. male contacted today regarding refills of specialty medication(s) Elviteg-Cobic-Emtricit-Tenofaf   Patient requested Delivery   Delivery date: 08/05/23   Verified address: [address] [place]   Medication will be filled on 08/04/23.

## 2023-08-25 ENCOUNTER — Other Ambulatory Visit (HOSPITAL_COMMUNITY): Payer: Self-pay

## 2023-08-29 ENCOUNTER — Other Ambulatory Visit: Payer: Self-pay

## 2023-08-29 NOTE — Progress Notes (Signed)
 Specialty Pharmacy Refill Coordination Note  Ryan Rich is a 58 y.o. male contacted today regarding refills of specialty medication(s) Elviteg-Cobic-Emtricit-TenofAF (Genvoya )   Patient requested Delivery   Delivery date: 08/31/23   Verified address: [address]  [place] [address]   Medication will be filled on 08/30/23.

## 2023-09-28 ENCOUNTER — Other Ambulatory Visit: Payer: Self-pay

## 2023-10-03 ENCOUNTER — Other Ambulatory Visit: Payer: Self-pay

## 2023-10-03 NOTE — Progress Notes (Signed)
Specialty Pharmacy Refill Coordination Note  Ryan Rich is a 58 y.o. male contacted today regarding refills of specialty medication(s) Elviteg-Cobic-Emtricit-TenofAF [name]   Patient requested Delivery   Delivery date: 10/06/23   Verified address: [address]  ,[place]. [address]   Medication will be filled on 10/05/23.

## 2023-10-04 ENCOUNTER — Other Ambulatory Visit: Payer: Self-pay
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
## 2023-11-01 ENCOUNTER — Other Ambulatory Visit: Payer: Self-pay

## 2023-11-01 NOTE — Progress Notes (Signed)
 Specialty Pharmacy Refill Coordination Note  Ryan Rich is a 58 y.o. male contacted today regarding refills of specialty medication(s) Elviteg-Cobic-Emtricit-TenofAF [name]   Patient requested (Patient-Rptd) Delivery   Delivery date: (Patient-Rptd) 11/09/23   Verified address: (Patient-Rptd) [address] [place] [address]   Medication will be filled on 11/08/23.

## 2023-11-29 ENCOUNTER — Other Ambulatory Visit: Payer: Self-pay | Admitting: Pharmacy Technician

## 2023-11-29 ENCOUNTER — Other Ambulatory Visit: Payer: Self-pay

## 2023-11-29 NOTE — Progress Notes (Signed)
 Specialty Pharmacy Refill Coordination Note  Ryan Rich is a 58 y.o. male contacted today regarding refills of specialty medication(s) No data recorded  Patient requested (Patient-Rptd) Delivery   Delivery date: (Patient-Rptd) 12/06/23   Verified address: (Patient-Rptd) [address] [place] [address]   Medication will be filled on 12/05/23.

## 2023-12-05 ENCOUNTER — Other Ambulatory Visit: Payer: Self-pay

## 2023-12-23 ENCOUNTER — Other Ambulatory Visit: Payer: Self-pay

## 2023-12-26 ENCOUNTER — Other Ambulatory Visit: Payer: Self-pay

## 2023-12-26 ENCOUNTER — Other Ambulatory Visit (HOSPITAL_COMMUNITY): Payer: Self-pay

## 2023-12-26 NOTE — Progress Notes (Signed)
 Specialty Pharmacy Refill Coordination Note  ASTIN HANKINSON is a 58 y.o. male contacted today regarding refills of specialty medication(s) Elviteg-Cobic-Emtricit-TenofAF (Genvoya )   Patient requested Delivery   Delivery date: 01/03/24   Verified address: [address] [place],  [place] [address]   Medication will be filled on 01/02/24.

## 2023-12-26 NOTE — Progress Notes (Signed)
 Specialty Pharmacy Ongoing Clinical Assessment Note  Ryan Rich is a 58 y.o. male who is being followed by the specialty pharmacy service for RxSp HIV   Patient's specialty medication(s) reviewed today: Elviteg-Cobic-Emtricit-TenofAF (Genvoya )   Missed doses in the last 4 weeks: 0   Patient/Caregiver did not have any additional questions or concerns.   Therapeutic benefit summary: Patient is achieving benefit   Adverse events/side effects summary: No adverse events/side effects   Patient's therapy is appropriate to: Continue    Goals Addressed             This Visit's Progress    Achieve Undetectable HIV Viral Load < 20   On track    Patient is on track. Patient will maintain adherence. Viral load remains undetectable long term.          Follow up: 6 months  [facility]

## 2023-12-27 NOTE — Progress Notes (Signed)
 The 10-year ASCVD risk score (Arnett DK, et al., 2019) is: 15.4%   Values used to calculate the score:     Age: 58 years     Sex: Male     Is Non-Hispanic African American: No     Diabetic: No     Tobacco smoker: Yes     Systolic Blood Pressure: 159 mmHg     Is BP treated: No     HDL Cholesterol: 41 mg/dL     Total Cholesterol: 178 mg/dL  Arlon Bergamo, BSN, RN

## 2024-01-30 ENCOUNTER — Other Ambulatory Visit: Payer: Self-pay

## 2024-01-30 ENCOUNTER — Encounter (INDEPENDENT_AMBULATORY_CARE_PROVIDER_SITE_OTHER): Payer: Self-pay

## 2024-01-31 ENCOUNTER — Other Ambulatory Visit: Payer: Self-pay

## 2024-01-31 ENCOUNTER — Other Ambulatory Visit (HOSPITAL_COMMUNITY): Payer: Self-pay

## 2024-01-31 NOTE — Progress Notes (Signed)
 Specialty Pharmacy Refill Coordination Note  MyChart Questionnaire Submission  Ryan Rich is a 58 y.o. male contacted today regarding refills of specialty medication(s) Genvoya .  Patient requested (Patient-Rptd) Delivery   Delivery date: 02/01/24  Verified address: (Patient-Rptd) [address] [place], [place] [address]  Medication will be filled on 01/31/24.

## 2024-02-28 ENCOUNTER — Other Ambulatory Visit (HOSPITAL_COMMUNITY): Payer: Self-pay

## 2024-02-29 ENCOUNTER — Other Ambulatory Visit (HOSPITAL_COMMUNITY): Payer: Self-pay

## 2024-02-29 ENCOUNTER — Other Ambulatory Visit: Payer: Self-pay

## 2024-02-29 ENCOUNTER — Encounter (INDEPENDENT_AMBULATORY_CARE_PROVIDER_SITE_OTHER): Payer: Self-pay

## 2024-02-29 NOTE — Progress Notes (Signed)
 Specialty Pharmacy Refill Coordination Note  MyChart Questionnaire Submission  Ryan Rich is a 58 y.o. male contacted today regarding refills of specialty medication(s) No data recorded  Doses on hand: (Patient-Rptd) 11   Patient requested: (Patient-Rptd) Delivery   Delivery date: 03/02/24   Verified address: (Patient-Rptd) [address] [place] [address]  Medication will be filled on 03/01/24.

## 2024-03-05 ENCOUNTER — Other Ambulatory Visit: Payer: Self-pay

## 2024-03-05 DIAGNOSIS — B20 Human immunodeficiency virus [HIV] disease: Secondary | ICD-10-CM

## 2024-03-05 DIAGNOSIS — Z113 Encounter for screening for infections with a predominantly sexual mode of transmission: Secondary | ICD-10-CM

## 2024-03-06 ENCOUNTER — Other Ambulatory Visit: Payer: Self-pay

## 2024-03-06 ENCOUNTER — Other Ambulatory Visit (HOSPITAL_COMMUNITY)
Admission: RE | Admit: 2024-03-06 | Discharge: 2024-03-06 | Disposition: A | Discharge status: Home / Self Care | Source: Ambulatory Visit | Attending: Infectious Disease | Admitting: Infectious Disease

## 2024-03-06 DIAGNOSIS — B20 Human immunodeficiency virus [HIV] disease: Secondary | ICD-10-CM

## 2024-03-06 DIAGNOSIS — Z113 Encounter for screening for infections with a predominantly sexual mode of transmission: Secondary | ICD-10-CM | POA: Insufficient documentation

## 2024-03-07 LAB — URINE CYTOLOGY ANCILLARY ONLY
Chlamydia: NEGATIVE
Comment: NEGATIVE
Comment: NORMAL
Neisseria Gonorrhea: NEGATIVE

## 2024-03-08 ENCOUNTER — Ambulatory Visit: Payer: Self-pay

## 2024-03-08 LAB — T-HELPER CELL (CD4) - (RCID CLINIC ONLY)
CD4 % Helper T Cell: 42 % (ref 33–65)
CD4 T Cell Abs: 856 /uL (ref 400–1790)

## 2024-03-08 NOTE — Telephone Encounter (Signed)
-----   Message from Kinde sent at 03/08/2024  6:41 AM EDT ----- Regarding: FW: Slight elev ast alt did he have any alcohol night before labs? ----- Message ----- From: Rebecka Hose Lab Results In Sent: 03/06/2024  10:45 PM EDT To: Jomarie LOISE Fleeta Kathie, MD

## 2024-03-08 NOTE — Telephone Encounter (Signed)
 Attempted to call patient regarding AST ALT results. Left voicemail requesting call back. Ryan Rich CHRISTELLA Code, RMA

## 2024-03-09 LAB — CBC WITH DIFFERENTIAL/PLATELET
Absolute Lymphocytes: 2230 {cells}/uL (ref 850–3900)
Absolute Monocytes: 592 {cells}/uL (ref 200–950)
Basophils Absolute: 52 {cells}/uL (ref 0–200)
Basophils Relative: 0.8 %
Eosinophils Absolute: 202 {cells}/uL (ref 15–500)
Eosinophils Relative: 3.1 %
HCT: 49.6 % (ref 38.5–50.0)
Hemoglobin: 16.6 g/dL (ref 13.2–17.1)
MCH: 30.5 pg (ref 27.0–33.0)
MCHC: 33.5 g/dL (ref 32.0–36.0)
MCV: 91.2 fL (ref 80.0–100.0)
MPV: 10.3 fL (ref 7.5–12.5)
Monocytes Relative: 9.1 %
Neutro Abs: 3426 {cells}/uL (ref 1500–7800)
Neutrophils Relative %: 52.7 %
Platelets: 263 Thousand/uL (ref 140–400)
RBC: 5.44 Million/uL (ref 4.20–5.80)
RDW: 13 % (ref 11.0–15.0)
Total Lymphocyte: 34.3 %
WBC: 6.5 Thousand/uL (ref 3.8–10.8)

## 2024-03-09 LAB — COMPLETE METABOLIC PANEL WITHOUT GFR
AG Ratio: 1.5 (calc) (ref 1.0–2.5)
ALT: 53 U/L — ABNORMAL HIGH (ref 9–46)
AST: 45 U/L — ABNORMAL HIGH (ref 10–35)
Albumin: 4.4 g/dL (ref 3.6–5.1)
Alkaline phosphatase (APISO): 92 U/L (ref 35–144)
BUN: 16 mg/dL (ref 7–25)
CO2: 27 mmol/L (ref 20–32)
Calcium: 9.2 mg/dL (ref 8.6–10.3)
Chloride: 104 mmol/L (ref 98–110)
Creat: 0.95 mg/dL (ref 0.70–1.30)
Globulin: 3 g/dL (ref 1.9–3.7)
Glucose, Bld: 98 mg/dL (ref 65–99)
Potassium: 4.1 mmol/L (ref 3.5–5.3)
Sodium: 137 mmol/L (ref 135–146)
Total Bilirubin: 0.8 mg/dL (ref 0.2–1.2)
Total Protein: 7.4 g/dL (ref 6.1–8.1)

## 2024-03-09 LAB — T PALLIDUM AB: T Pallidum Abs: POSITIVE — AB

## 2024-03-09 LAB — HIV-1 RNA QUANT-NO REFLEX-BLD
HIV 1 RNA Quant: <20 {copies}/mL — AB
HIV-1 RNA Quant, Log: <1.3 {Log_copies}/mL — AB

## 2024-03-09 LAB — RPR TITER: RPR Titer: 1:1 {titer} — ABNORMAL HIGH

## 2024-03-09 LAB — RPR: RPR Ser Ql: REACTIVE — AB

## 2024-03-12 ENCOUNTER — Other Ambulatory Visit: Payer: BC Managed Care – PPO

## 2024-03-20 ENCOUNTER — Encounter (INDEPENDENT_AMBULATORY_CARE_PROVIDER_SITE_OTHER): Payer: Self-pay

## 2024-03-20 ENCOUNTER — Other Ambulatory Visit: Payer: Self-pay

## 2024-03-20 ENCOUNTER — Other Ambulatory Visit: Payer: Self-pay | Admitting: Pharmacy Technician

## 2024-03-20 NOTE — Progress Notes (Signed)
 Specialty Pharmacy Refill Coordination Note  Ryan Rich is a 58 y.o. male contacted today regarding refills of specialty medication(s) Elviteg-Cobic-Emtricit-TenofAF (Genvoya )   Patient requested (Patient-Rptd) Delivery   Delivery date: 03/29/24 Verified address: (Patient-Rptd) [address] [place] [address]   Medication will be filled on 03/28/24.
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
## 2024-03-26 ENCOUNTER — Encounter: Payer: Self-pay | Admitting: Infectious Disease

## 2024-03-26 ENCOUNTER — Other Ambulatory Visit: Payer: Self-pay

## 2024-03-26 ENCOUNTER — Ambulatory Visit: Payer: Self-pay | Admitting: Infectious Disease

## 2024-03-26 VITALS — BP 154/93 | HR 63 | Temp 97.9°F | Ht 70.0 in | Wt 203.0 lb

## 2024-03-26 DIAGNOSIS — I10 Essential (primary) hypertension: Secondary | ICD-10-CM

## 2024-03-26 DIAGNOSIS — E785 Hyperlipidemia, unspecified: Secondary | ICD-10-CM | POA: Diagnosis not present

## 2024-03-26 DIAGNOSIS — Z8619 Personal history of other infectious and parasitic diseases: Secondary | ICD-10-CM

## 2024-03-26 DIAGNOSIS — R7989 Other specified abnormal findings of blood chemistry: Secondary | ICD-10-CM | POA: Diagnosis not present

## 2024-03-26 DIAGNOSIS — B20 Human immunodeficiency virus [HIV] disease: Secondary | ICD-10-CM

## 2024-03-26 DIAGNOSIS — Z23 Encounter for immunization: Secondary | ICD-10-CM | POA: Diagnosis not present

## 2024-03-26 DIAGNOSIS — Z1212 Encounter for screening for malignant neoplasm of rectum: Secondary | ICD-10-CM

## 2024-03-26 MED ORDER — GENVOYA 150-150-200-10 MG PO TABS
1.0000 | ORAL_TABLET | Freq: Every day | ORAL | 11 refills | Status: AC
  Filled 2024-03-26: qty 30, 30d supply, fill #0
  Filled 2024-04-24: qty 30, 30d supply, fill #1
  Filled 2024-05-29: qty 30, 30d supply, fill #2
  Filled 2024-06-27: qty 30, 30d supply, fill #3
  Filled 2024-07-25 – 2024-07-27 (×2): qty 30, 30d supply, fill #4
  Filled 2024-08-24 – 2024-08-31 (×2): qty 30, 30d supply, fill #5

## 2024-03-27 ENCOUNTER — Other Ambulatory Visit: Payer: Self-pay

## 2024-04-03 LAB — CYTOLOGY - PAP
Comment: NEGATIVE
Diagnosis: UNDETERMINED — AB
High risk HPV: POSITIVE — AB

## 2024-04-04 ENCOUNTER — Encounter: Payer: Self-pay | Admitting: Infectious Disease

## 2024-04-04 ENCOUNTER — Other Ambulatory Visit: Payer: Self-pay | Admitting: Infectious Disease

## 2024-04-04 DIAGNOSIS — R85610 Atypical squamous cells of undetermined significance on cytologic smear of anus (ASC-US): Secondary | ICD-10-CM | POA: Insufficient documentation

## 2024-04-05 ENCOUNTER — Ambulatory Visit: Payer: Self-pay

## 2024-04-24 ENCOUNTER — Other Ambulatory Visit: Payer: Self-pay

## 2024-04-24 NOTE — Progress Notes (Signed)
 Specialty Pharmacy Refill Coordination Note  Ryan Rich is a 58 y.o. male contacted today regarding refills of specialty medication(s) Elviteg-Cobic-Emtricit-TenofAF (Genvoya )   Patient requested Delivery   Delivery date: 05/02/24   Verified address: [address] [place] [address]   Medication will be filled on 09.16.25.

## 2024-05-29 ENCOUNTER — Other Ambulatory Visit (HOSPITAL_COMMUNITY): Payer: Self-pay

## 2024-05-30 ENCOUNTER — Other Ambulatory Visit: Payer: Self-pay

## 2024-05-30 NOTE — Progress Notes (Signed)
 Specialty Pharmacy Refill Coordination Note  Ryan Rich is a 58 y.o. male contacted today regarding refills of specialty medication(s) Elviteg-Cobic-Emtricit-TenofAF (Genvoya )   Patient requested Delivery   Delivery date: 06/05/24   Verified address: [address] [place] [address]   Medication will be filled on 06/04/24.

## 2024-06-04 ENCOUNTER — Other Ambulatory Visit: Payer: Self-pay

## 2024-06-18 ENCOUNTER — Other Ambulatory Visit: Payer: Self-pay

## 2024-06-27 ENCOUNTER — Other Ambulatory Visit: Payer: Self-pay

## 2024-06-29 ENCOUNTER — Other Ambulatory Visit: Payer: Self-pay

## 2024-06-29 ENCOUNTER — Encounter (INDEPENDENT_AMBULATORY_CARE_PROVIDER_SITE_OTHER): Payer: Self-pay

## 2024-07-02 ENCOUNTER — Other Ambulatory Visit: Payer: Self-pay

## 2024-07-02 NOTE — Progress Notes (Signed)
 Specialty Pharmacy Refill Coordination Note  Ryan Rich is a 58 y.o. male contacted today regarding refills of specialty medication(s) Elviteg-Cobic-Emtricit-TenofAF (Genvoya )   Patient requested No data recorded  Delivery date: 07/04/24   Verified address: [address] [place] [id number]   Medication will be filled on: 07/03/24

## 2024-07-25 ENCOUNTER — Other Ambulatory Visit: Payer: Self-pay

## 2024-07-27 ENCOUNTER — Encounter (INDEPENDENT_AMBULATORY_CARE_PROVIDER_SITE_OTHER): Payer: Self-pay

## 2024-07-27 ENCOUNTER — Other Ambulatory Visit: Payer: Self-pay

## 2024-07-30 ENCOUNTER — Other Ambulatory Visit: Payer: Self-pay

## 2024-07-30 NOTE — Progress Notes (Signed)
 Specialty Pharmacy Refill Coordination Note  Ryan Rich is a 58 y.o. male contacted today regarding refills of specialty medication(s) Elviteg-Cobic-Emtricit-TenofAF (Genvoya )   Patient requested Delivery   Delivery date: 08/03/24   Verified address: [address] [place], [place], [address]   Medication will be filled on: 08/02/24

## 2024-08-02 ENCOUNTER — Other Ambulatory Visit: Payer: Self-pay

## 2024-08-17 ENCOUNTER — Other Ambulatory Visit (HOSPITAL_COMMUNITY): Payer: Self-pay

## 2024-08-24 ENCOUNTER — Other Ambulatory Visit: Payer: Self-pay

## 2024-08-31 ENCOUNTER — Other Ambulatory Visit: Payer: Self-pay

## 2024-09-03 ENCOUNTER — Other Ambulatory Visit: Payer: Self-pay

## 2024-09-03 NOTE — Progress Notes (Signed)
 Specialty Pharmacy Refill Coordination Note  RITHY MANDLEY is a 59 y.o. male contacted today regarding refills of specialty medication(s) Elviteg-Cobic-Emtricit-TenofAF (Genvoya )   Patient requested Delivery   Delivery date: 09/06/24   Verified address: [address] [place] [address]   Medication will be filled on: 09/05/24

## 2024-09-05 ENCOUNTER — Other Ambulatory Visit: Payer: Self-pay

## 2025-01-14 ENCOUNTER — Other Ambulatory Visit

## 2025-01-28 ENCOUNTER — Ambulatory Visit: Payer: Self-pay | Admitting: Infectious Disease
